# Patient Record
Sex: Female | Born: 1985 | Hispanic: Yes | Marital: Married | Smoking: Never smoker
Health system: Southern US, Community
[De-identification: ages and names within clinical notes are randomized; demographics above are authoritative.]

## PROBLEM LIST (undated history)

## (undated) DIAGNOSIS — O24419 Gestational diabetes mellitus in pregnancy, unspecified control: Secondary | ICD-10-CM

## (undated) HISTORY — DX: Gestational diabetes mellitus in pregnancy, unspecified control: O24.419

---

## 2007-07-18 ENCOUNTER — Inpatient Hospital Stay: Payer: Self-pay

## 2017-04-20 ENCOUNTER — Other Ambulatory Visit: Payer: Self-pay | Admitting: Otolaryngology

## 2017-04-20 DIAGNOSIS — IMO0001 Reserved for inherently not codable concepts without codable children: Secondary | ICD-10-CM

## 2017-04-20 DIAGNOSIS — H9042 Sensorineural hearing loss, unilateral, left ear, with unrestricted hearing on the contralateral side: Secondary | ICD-10-CM

## 2017-04-28 ENCOUNTER — Ambulatory Visit: Admission: RE | Admit: 2017-04-28 | Payer: Self-pay | Source: Ambulatory Visit

## 2018-02-03 NOTE — L&D Delivery Note (Signed)
Date of delivery 09/04/2018 Estimated Date of Delivery: 09/11/18 Patient's last menstrual period was 12/12/2017. EGA: [redacted]w[redacted]d  Delivery Note At 2:51 AM a viable female was delivered via Vaginal, Spontaneous (Presentation: cephalic; LOA).  APGAR: 1, 9; weight 7 lb 5.5 oz (3330 g).   Placenta status: spontaneous, intact, small vasculature on fetal bed, no calcifications, central insertion.  Cord: 3vv with the following complications: none apparent.  Cord pH: collected and declined by peds  Anesthesia:  None (Lidocaine for repair) Episiotomy: None Lacerations: 2nd degree Suture Repair: 3.0 vicryl rapide Est. Blood Loss (mL): 12cc  Mom presented to L&D with induction of labor for gestational diabetes. She tested COVID positive, and was placed in a negative pressure room with staff in appropriate PPE.  She was given cytotec, and then ruptured for meconium fluid. Pitocin was started for augmentation.  Progressed to complete.  Second stage: <36min, with delivery of fetal head with restitution to LOT.  Anterior then posterior shoulders delivered without difficulty.  Baby placed on mom's chest, and attended to by peds. Baby had done and then became flaccid, and the cord was cut quickly and peds brought the baby to the warmer for attention.  Neo NP was in attendance.  Cord portion collected and gasses drawn.  Cord blood also collected. Placenta spontaneously delivered, intact.   IV pitocin given for hemorrhage prophylaxis. 2nd degree was repaired with 3-0 vicryl rapide in standard fashion.   We sang happy birthday to baby Corliss Skains.   Mom to postpartum.  Baby to Couplet care / Skin to Skin.  Chelsea C Ward 09/04/2018, 8:11 AM

## 2018-02-24 LAB — OB RESULTS CONSOLE HGB/HCT, BLOOD: Hemoglobin: 13.2

## 2018-02-24 LAB — OB RESULTS CONSOLE ABO/RH: RH Type: POSITIVE

## 2018-02-24 LAB — OB RESULTS CONSOLE GC/CHLAMYDIA
Chlamydia: NEGATIVE
Gonorrhea: NEGATIVE

## 2018-02-24 LAB — OB RESULTS CONSOLE RPR: RPR: NONREACTIVE

## 2018-02-24 LAB — OB RESULTS CONSOLE HEPATITIS B SURFACE ANTIGEN: Hepatitis B Surface Ag: NEGATIVE

## 2018-02-24 LAB — OB RESULTS CONSOLE HIV ANTIBODY (ROUTINE TESTING): HIV: NONREACTIVE

## 2018-02-24 LAB — OB RESULTS CONSOLE ANTIBODY SCREEN: Antibody Screen: NEGATIVE

## 2018-02-25 ENCOUNTER — Other Ambulatory Visit: Payer: Self-pay | Admitting: Advanced Practice Midwife

## 2018-02-25 DIAGNOSIS — Z369 Encounter for antenatal screening, unspecified: Secondary | ICD-10-CM

## 2018-03-11 ENCOUNTER — Ambulatory Visit (HOSPITAL_BASED_OUTPATIENT_CLINIC_OR_DEPARTMENT_OTHER)
Admission: RE | Admit: 2018-03-11 | Discharge: 2018-03-11 | Disposition: A | Discharge status: Home / Self Care | Payer: BLUE CROSS/BLUE SHIELD | Source: Ambulatory Visit | Attending: Maternal & Fetal Medicine | Admitting: Maternal & Fetal Medicine

## 2018-03-11 ENCOUNTER — Ambulatory Visit
Admission: RE | Admit: 2018-03-11 | Discharge: 2018-03-11 | Disposition: A | Discharge status: Home / Self Care | Payer: BLUE CROSS/BLUE SHIELD | Source: Ambulatory Visit | Attending: Maternal & Fetal Medicine | Admitting: Maternal & Fetal Medicine

## 2018-03-11 VITALS — BP 119/64 | HR 92 | Temp 99.2°F | Resp 18 | Wt 223.0 lb

## 2018-03-11 DIAGNOSIS — Z3A13 13 weeks gestation of pregnancy: Secondary | ICD-10-CM | POA: Diagnosis not present

## 2018-03-11 DIAGNOSIS — O09521 Supervision of elderly multigravida, first trimester: Secondary | ICD-10-CM | POA: Insufficient documentation

## 2018-03-11 DIAGNOSIS — Z369 Encounter for antenatal screening, unspecified: Secondary | ICD-10-CM | POA: Diagnosis not present

## 2018-03-11 NOTE — Progress Notes (Signed)
Referring Provider:   [facility] Department Length of Consultation: 55 minutes  Ms. Agustiniano Bradly Bienenstock was referred to [facility] for genetic counseling because of advanced maternal age.  The patient will be 33 years old at the time of delivery.  This note summarizes the information we discussed with the aid of a Spanish interpreter.   We explained that the chance of a chromosome abnormality increases with maternal age.  Chromosomes and examples of chromosome problems were reviewed.  Humans typically have 46 chromosomes in each cell, with half passed through each sperm and egg.  Any change in the number or structure of chromosomes can increase the risk of problems in the physical and mental development of a pregnancy.   Based upon age of the patient, the chance of any chromosome abnormality was 1 in 36. The chance of Down syndrome, the most common chromosome problem associated with maternal age, was 1 in 1.  The risk of chromosome problems is in addition to the 3% general population risk for birth defects and mental retardation.  The greatest chance, of course, is that the baby would be born in good health.  We discussed the following prenatal screening and testing options for this pregnancy:  First trimester screening, which includes nuchal translucency ultrasound screen and first trimester maternal serum marker screening.  The nuchal translucency has approximately an 80% detection rate for Down syndrome and can be positive for other chromosome abnormalities as well as heart defects.  When combined with a maternal serum marker screening, the detection rate is up to 90% for Down syndrome and up to 97% for trisomy 18.     The chorionic villus sampling procedure is available for first trimester chromosome analysis.  This involves the withdrawal of a small amount of chorionic villi (tissue from the developing placenta).  Risk of pregnancy loss is estimated to  be approximately 1 in 200 to 1 in 100 (0.5 to 1%).  There is approximately a 1% (1 in 100) chance that the CVS chromosome results will be unclear.  Chorionic villi cannot be tested for neural tube defects.     Maternal serum marker screening, a blood test that measures pregnancy proteins, can provide risk assessments for Down syndrome, trisomy 18, and open neural tube defects (spina bifida, anencephaly). Because it does not directly examine the fetus, it cannot positively diagnose or rule out these problems.  Targeted ultrasound uses high frequency sound waves to create an image of the developing fetus.  An ultrasound is often recommended as a routine means of evaluating the pregnancy.  It is also used to screen for fetal anatomy problems (for example, a heart defect) that might be suggestive of a chromosomal or other abnormality.   Amniocentesis involves the removal of a small amount of amniotic fluid from the sac surrounding the fetus with the use of a thin needle inserted through the maternal abdomen and uterus.  Ultrasound guidance is used throughout the procedure.  Fetal cells from amniotic fluid are directly evaluated and > 99.5% of chromosome problems and > 98% of open neural tube defects can be detected. This procedure is generally performed after the 15th week of pregnancy.  The main risks to this procedure include complications leading to miscarriage in less than 1 in 200 cases (0.5%).  We also reviewed the availability of cell free fetal DNA testing from maternal blood to determine whether or not the baby may have either Down syndrome, trisomy 69, or trisomy 83.  This test utilizes a maternal blood sample and DNA sequencing technology to isolate circulating cell free fetal DNA from maternal plasma.  The fetal DNA can then be analyzed for DNA sequences that are derived from the three most common chromosomes involved in aneuploidy, chromosomes 13, 18, and 21.  If the overall amount of DNA is greater  than the expected level for any of these chromosomes, aneuploidy is suspected.  While we do not consider it a replacement for invasive testing and karyotype analysis, a negative result from this testing would be reassuring, though not a guarantee of a normal chromosome complement for the baby.  An abnormal result is certainly suggestive of an abnormal chromosome complement, though we would still recommend CVS or amniocentesis to confirm any findings from this testing.  Cystic Fibrosis and Spinal Muscular Atrophy (SMA) screening were also discussed with the patient. Both conditions are recessive, which means that both parents must be carriers in order to have a child with the disease.  Cystic fibrosis (CF) is one of the most common genetic conditions in persons of Caucasian ancestry.  This condition occurs in approximately 1 in 2,500 Caucasian persons and results in thickened secretions in the lungs, digestive, and reproductive systems.  For a baby to be at risk for having CF, both of the parents must be carriers for this condition.  Approximately 1 in 67 Caucasian persons is a carrier for CF.  Current carrier testing looks for the most common mutations in the gene for CF and can detect approximately 90% of carriers in the Caucasian population.  This means that the carrier screening can greatly reduce, but cannot eliminate, the chance for an individual to have a child with CF.  If an individual is found to be a carrier for CF, then carrier testing would be available for the partner. As part of [place]'s newborn screening profile, all babies born in the state of [place] will have a two-tier screening process.  Specimens are first tested to determine the concentration of immunoreactive trypsinogen (IRT).  The top 5% of specimens with the highest IRT values then undergo DNA testing using a panel of over 40 common CF mutations. SMA is a neurodegenerative disorder that leads to atrophy of skeletal muscle  and overall weakness.  This condition is also more prevalent in the Caucasian population, with 1 in 40-1 in 60 persons being a carrier and 1 in 6,000-1 in 10,000 children being affected.  There are multiple forms of the disease, with some causing death in infancy to other forms with survival into adulthood.  The genetics of SMA is complex, but carrier screening can detect up to 95% of carriers in the Caucasian population.  Similar to CF, a negative result can greatly reduce, but cannot eliminate, the chance to have a child with SMA.  The patient and her partner are both from [place].  Prior hemoglobinopathy screening was normal at [facility].  We obtained a detailed family history and pregnancy history.  Kern Alberta, the father of the baby, reported a paternal half brother with developmental delays, discolored patches of skin and growths under his skin.  He is 35 years old and Kern Alberta did not know the name for his condition.  We reviewed that there are genetic conditions, such as Neurofibromatosis, that would have features similar to those described, though without a specific diagnosis it is difficult to provide an accurate risk assessment for other family members.  If more is learned, he was encouraged to contact us. The  remainder of the family history is unremarkable for birth defects, developmental delays, recurrent pregnancy loss or known chromosome abnormalities.  Ms. Waymon Budgegustiniano Martinez stated that this is her fourth pregnancy, the third with Kern AlbertaSergio.  Her first pregnancy was when she was very young and she stated that is was a loss, though the details were not elaborated.  The couple has two children, ages 1716 and 10 years.  The 35 year old son has a thyroid condition but is otherwise healthy.  She reported no complications or exposures in this pregnancy that would be expected to increase the risk for birth defects.  After consideration of the options, Ms. Agustiniano Bradly BienenstockMartinez elected to proceed with MaterniT21 PLUS  with SCA and carrier screening for CF and SMA.    An ultrasound was performed at the time of the visit.  The gestational age was consistent with 13 weeks.  Fetal anatomy could not be assessed due to early gestational age.  Please refer to the ultrasound report for details of that study.  Ms. Waymon Budgegustiniano Martinez was encouraged to call with questions or concerns.  We can be contacted at [phone].   Tests Ordered:  MaterniT21 PLUS with SCA, CF and SMA carrier screening   Cherly Andersoneborah F. Vayden Weinand, MS, CGC

## 2018-03-16 LAB — MATERNIT21 PLUS CORE+SCA
Chromosome 13: NEGATIVE
Chromosome 18: NEGATIVE
Chromosome 21: NEGATIVE
Y Chromosome: DETECTED

## 2018-03-18 ENCOUNTER — Other Ambulatory Visit
Admission: RE | Admit: 2018-03-18 | Discharge: 2018-03-18 | Disposition: A | Discharge status: Home / Self Care | Payer: BLUE CROSS/BLUE SHIELD | Source: Ambulatory Visit | Attending: Maternal & Fetal Medicine | Admitting: Maternal & Fetal Medicine

## 2018-03-18 ENCOUNTER — Ambulatory Visit
Admission: RE | Admit: 2018-03-18 | Discharge: 2018-03-18 | Disposition: A | Discharge status: Home / Self Care | Payer: BLUE CROSS/BLUE SHIELD | Source: Ambulatory Visit | Attending: Maternal & Fetal Medicine | Admitting: Maternal & Fetal Medicine

## 2018-03-18 DIAGNOSIS — Z1379 Encounter for other screening for genetic and chromosomal anomalies: Secondary | ICD-10-CM | POA: Diagnosis present

## 2018-03-22 ENCOUNTER — Telehealth: Payer: Self-pay | Admitting: Obstetrics and Gynecology

## 2018-03-22 NOTE — Telephone Encounter (Signed)
The patient was informed of the results of her recent MaterniT21 testing which yielded NEGATIVE results.  The patient's specimen showed DNA consistent with two copies of chromosomes 21, 18 and 13.  The sensitivity for trisomy 25, trisomy 57 and trisomy 26 using this testing are reported as 99.1%, 99.9% and 91.7% respectively.  Thus, while the results of this testing are highly accurate, they are not considered diagnostic at this time.  Should more definitive information be desired, the patient may still consider amniocentesis.   As requested to know by the patient, sex chromosome analysis was included for this sample.  Results are normal, though the patient did not want to know the gender at this time. This is predicted with >99% accuracy.  A maternal serum AFP only should be considered if screening for neural tube defects is desired.  Testing for CF and SMA carrier screening is pending.  We may be reached at [phone] with any questions or concerns.   Cherly Anderson, MS, CGC

## 2018-03-24 LAB — CYSTIC FIBROSIS GENE TEST

## 2018-03-29 LAB — SMN1 COPY NUMBER ANALYSIS (SMA CARRIER SCREENING)

## 2018-03-29 NOTE — Progress Notes (Signed)
Pt seen by me, agree with assessment and plan as outlined in CGC Wells's note 

## 2018-04-01 ENCOUNTER — Telehealth: Payer: Self-pay | Admitting: Obstetrics and Gynecology

## 2018-04-01 NOTE — Telephone Encounter (Signed)
The patient was contacted with the assistance of a Spanish interpreter with the following results:  CF carrier testing:  CF is a genetic condition that occurs most often in Caucasian persons.  It primarily affects the lungs, digestive, and reproductive systems.  For someone to be at risk for having CF, both of their parents must be carriers for CF.  The testing can detect many persons who are carriers for CF and therefore determine if the pregnancy is at an increased risk for this condition.  The blood test results were negative when examined for the 32 most common mutations (or changes) in the gene for CF.  This means that she does not carry any of the most common changes in this gene.  Testing for these 32 mutations detects approximately 73% of carriers who are Hispanic.  Therefore, the chance that she is a carrier based on this negative result has been reduced from 1 in 46 to approximately 1 in 168.  Because this testing cannot detect all changes that may cause CF, we cannot eliminate the chance that this individual is a carrier completely.   SMA carrier screening results:  The results of the SMA carrier screening are also available.  SMA is also a recessive genetic condition with variable age of onset and severity caused by mutations in the SMN1 gene.  This carrier testing assesses the number of copies of this gene.  Persons with one copy of the SMN1 gene are carriers, and those with no copies are affected with the condition.  Individuals with two or more copies have a reduced chance to be a carrier.  Not all mutations can be detected with this testing, though it can detect 90.0% of carriers in the Hispanic population.  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  Again, this testing cannot eliminate the chance to have a child with SMA, but dramatically reduces the chance.    We encouraged the  patient to call with any questions or concerns as they arise.  We may be reached at [phone].  Cherly Anderson, MS, CGC

## 2018-04-12 ENCOUNTER — Other Ambulatory Visit: Payer: Self-pay

## 2018-04-12 DIAGNOSIS — O09521 Supervision of elderly multigravida, first trimester: Secondary | ICD-10-CM

## 2018-04-15 ENCOUNTER — Ambulatory Visit
Admission: RE | Admit: 2018-04-15 | Discharge: 2018-04-15 | Disposition: A | Discharge status: Home / Self Care | Payer: BLUE CROSS/BLUE SHIELD | Source: Ambulatory Visit | Attending: Maternal and Fetal Medicine | Admitting: Maternal and Fetal Medicine

## 2018-04-15 ENCOUNTER — Other Ambulatory Visit: Payer: Self-pay

## 2018-04-15 ENCOUNTER — Other Ambulatory Visit: Payer: Self-pay | Admitting: Maternal and Fetal Medicine

## 2018-04-15 DIAGNOSIS — O09521 Supervision of elderly multigravida, first trimester: Secondary | ICD-10-CM

## 2018-04-15 DIAGNOSIS — Z3A18 18 weeks gestation of pregnancy: Secondary | ICD-10-CM | POA: Diagnosis not present

## 2018-04-15 DIAGNOSIS — O09522 Supervision of elderly multigravida, second trimester: Secondary | ICD-10-CM | POA: Insufficient documentation

## 2018-04-15 DIAGNOSIS — O321XX0 Maternal care for breech presentation, not applicable or unspecified: Secondary | ICD-10-CM | POA: Diagnosis not present

## 2018-06-21 LAB — HIV ANTIBODY (ROUTINE TESTING W REFLEX): HIV 1&2 Ab, 4th Generation: NONREACTIVE

## 2018-06-22 LAB — OB RESULTS CONSOLE RPR: RPR: NONREACTIVE

## 2018-07-13 ENCOUNTER — Other Ambulatory Visit: Payer: Self-pay

## 2018-07-13 ENCOUNTER — Encounter: Payer: BC Managed Care – PPO | Attending: Nurse Practitioner | Admitting: *Deleted

## 2018-07-13 ENCOUNTER — Encounter: Payer: Self-pay | Admitting: *Deleted

## 2018-07-13 VITALS — BP 110/78 | Ht 64.0 in | Wt 233.5 lb

## 2018-07-13 DIAGNOSIS — O24410 Gestational diabetes mellitus in pregnancy, diet controlled: Secondary | ICD-10-CM

## 2018-07-13 DIAGNOSIS — O24419 Gestational diabetes mellitus in pregnancy, unspecified control: Secondary | ICD-10-CM | POA: Diagnosis present

## 2018-07-13 DIAGNOSIS — Z3A00 Weeks of gestation of pregnancy not specified: Secondary | ICD-10-CM | POA: Diagnosis not present

## 2018-07-13 NOTE — Patient Instructions (Signed)
Read booklet on Gestational Diabetes Follow Gestational Meal Planning Guidelines Avoid sugar sweetened drinks (tea, coffee, soda) Complete a 3 Day Food Record and bring to next appointment Check blood sugars 4 x day - before breakfast and 2 hrs after every meal and record  Bring blood sugar log to all appointments Call MD for prescription for meter strips and lancets Strips   Contour Next Lancets   Contour Microlet Purchase urine ketone strips if ordered by MD and check urine ketones every am:  If + increase bedtime snack to 1 protein and 2 carbohydrate servings Walk 20-30 minutes at least 5 x week if permitted by MD

## 2018-07-13 NOTE — Progress Notes (Signed)
Diabetes Self-Management Education  Visit Type: First/Initial  Appt. Start Time: 0930 Appt. End Time: 1130  07/13/2018  Courtney Munoz, identified by name and date of birth, is a 33 y.o. female with a diagnosis of Diabetes: Gestational Diabetes.   ASSESSMENT  Blood pressure 110/78, height 5\' 4"  (1.626 m), weight 233 lb 8 oz (105.9 kg), last menstrual period 12/12/2017. estimated date of delivery 09/11/2018 Body mass index is 40.08 kg/m.  Diabetes Self-Management Education - 07/13/18 1255      Visit Information   Visit Type  First/Initial      Initial Visit   Diabetes Type  Gestational Diabetes    Are you currently following a meal plan?  No    Are you taking your medications as prescribed?  Yes    Date Diagnosed  1 week ago      Health Coping   How would you rate your overall health?  Good      Psychosocial Assessment   Patient Belief/Attitude about Diabetes  Other (comment)   "something new to me"   Self-care barriers  Low literacy;English as a second language    Self-management support  Doctor's office;Family    Other persons present Interpreter    Patient Concerns  Nutrition/Meal planning;Other (comment)   "causes of diabetes"   Special Needs  Other (comment)   Spanish materials   Preferred Learning Style  Auditory    Learning Readiness  Ready    How often do you need to have someone help you when you read instructions, pamphlets, or other written materials from your doctor or pharmacy?  1 - Never   if written in Spanish   What is the last grade level you completed in school?  6th      Pre-Education Assessment   Patient understands the diabetes disease and treatment process.  Needs Instruction    Patient understands incorporating nutritional management into lifestyle.  Needs Instruction    Patient undertands incorporating physical activity into lifestyle.  Needs Instruction    Patient understands using medications safely.  Needs Instruction    Patient understands monitoring blood glucose, interpreting and using results  Needs Instruction    Patient understands prevention, detection, and treatment of acute complications.  Needs Instruction    Patient understands prevention, detection, and treatment of chronic complications.  Needs Instruction    Patient understands how to develop strategies to address psychosocial issues.  Needs Instruction    Patient understands how to develop strategies to promote health/change behavior.  Needs Instruction      Complications   How often do you check your blood sugar?  0 times/day (not testing)   Provided Contour Next meter and instructed in use. BG upon return demonstration was 96 mg/dL at 10:40 am - fasting.    Have you had a dilated eye exam in the past 12 months?  No    Have you had a dental exam in the past 12 months?  Yes    Are you checking your feet?  No      Dietary Intake   Breakfast  7:30 am - fast food biscuit - egg, cheese, bacon; sometimes milk and cereal    Snack (morning)  reports no snacks - doesn't eat any non-starchy vegetables    Lunch  2:00 pm - chicken with rice, beans, tortilla    Dinner  1:00 am - pt works 3rd shift - eats left overs from lunch    Beverage(s)  sugar sweetened tea, coffee, soda  Exercise   Exercise Type  ADL's      Patient Education   Previous Diabetes Education  No    Disease state   Definition of diabetes, type 1 and 2, and the diagnosis of diabetes;Factors that contribute to the development of diabetes    Nutrition management   Role of diet in the treatment of diabetes and the relationship between the three main macronutrients and blood glucose level;Reviewed blood glucose goals for pre and post meals and how to evaluate the patients' food intake on their blood glucose level.    Physical activity and exercise   Role of exercise on diabetes management, blood pressure control and cardiac health.    Monitoring  Taught/evaluated SMBG meter.;Purpose and  frequency of SMBG.;Taught/discussed recording of test results and interpretation of SMBG.;Ketone testing, when, how.    Chronic complications  Relationship between chronic complications and blood glucose control    Psychosocial adjustment  Identified and addressed patients feelings and concerns about diabetes    Preconception care  Pregnancy and GDM  Role of pre-pregnancy blood glucose control on the development of the fetus;Role of family planning for patients with diabetes;Reviewed with patient blood glucose goals with pregnancy      Individualized Goals (developed by patient)   Reducing Risk Know more about the causes of diabetes     Outcomes   Expected Outcomes  Demonstrated interest in learning. Expect positive outcomes       Individualized Plan for Diabetes Self-Management Training:   Learning Objective:  Patient will have a greater understanding of diabetes self-management. Patient education plan is to attend individual and/or group sessions per assessed needs and concerns.   Plan:   Patient Instructions  Read booklet on Gestational Diabetes Follow Gestational Meal Planning Guidelines Avoid sugar sweetened drinks (tea, coffee, soda) Complete a 3 Day Food Record and bring to next appointment Check blood sugars 4 x day - before breakfast and 2 hrs after every meal and record  Bring blood sugar log to all appointments Call MD for prescription for meter strips and lancets Strips   Contour Next Lancets   Contour Microlet Purchase urine ketone strips if ordered by MD and check urine ketones every am:  If + increase bedtime snack to 1 protein and 2 carbohydrate servings Walk 20-30 minutes at least 5 x week if permitted by MD  Expected Outcomes:  Demonstrated interest in learning. Expect positive outcomes  Education material provided:  Gestational Booklet (Spanish) Gestational Meal Planning Guidelines (Spanish) Simple Meal Plan (Spanish) Viewed Gestational Diabetes Video  (Spanish) Meter = Contour Next 3 Day Food Record (Spanish) Goals for a Healthy Pregnancy (Spanish)  If problems or questions, patient to contact team via:   Sharion SettlerSheila Alaney Witter, RN, CCM, CDE [phone]  Future DSME appointment:  July 20, 2018 with the dietitian

## 2018-07-20 ENCOUNTER — Encounter: Payer: Self-pay | Admitting: Dietician

## 2018-07-20 ENCOUNTER — Other Ambulatory Visit: Payer: Self-pay

## 2018-07-20 ENCOUNTER — Encounter: Payer: BC Managed Care – PPO | Admitting: Dietician

## 2018-07-20 DIAGNOSIS — O24419 Gestational diabetes mellitus in pregnancy, unspecified control: Secondary | ICD-10-CM | POA: Diagnosis not present

## 2018-07-20 DIAGNOSIS — O24410 Gestational diabetes mellitus in pregnancy, diet controlled: Secondary | ICD-10-CM

## 2018-07-20 NOTE — Progress Notes (Signed)
   Patient's BG record indicates BGs slightly above goal; one fasting reading of 96, and 2 days of post-meal readings ranging 93-131. Patient was given strips at her pharmacy that did not match the meter provided (she received regular contour strips but has a Contour Next meter). RN advised patient on correcting strips/ meter.   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 She states she has list of possible medications but does not know which to get. Advised her to consult pharmacist.  Patient's food diary indicates some varying carbohydrate intake, lack of vegetables with some meals.   Provided 1800kcal meal plan, and wrote individualized menus based on patient's food preferences.  Instructed patient on food safety, including avoidance of Listeriosis, and limiting mercury from fish.  Discussed importance of maintaining healthy lifestyle habits to reduce risk of Type 2 DM as well as Gestational DM with any future pregnancies.  Advised patient to use any remaining testing supplies to test some BGs after delivery, and to have BG tested ideally annually, as well as prior to attempting future pregnancies.

## 2018-07-28 DIAGNOSIS — Z8632 Personal history of gestational diabetes: Secondary | ICD-10-CM | POA: Insufficient documentation

## 2018-07-28 DIAGNOSIS — E669 Obesity, unspecified: Secondary | ICD-10-CM | POA: Insufficient documentation

## 2018-07-28 DIAGNOSIS — O24410 Gestational diabetes mellitus in pregnancy, diet controlled: Secondary | ICD-10-CM

## 2018-07-28 DIAGNOSIS — O0993 Supervision of high risk pregnancy, unspecified, third trimester: Secondary | ICD-10-CM

## 2018-07-28 DIAGNOSIS — Z6836 Body mass index (BMI) 36.0-36.9, adult: Secondary | ICD-10-CM

## 2018-07-28 DIAGNOSIS — O24419 Gestational diabetes mellitus in pregnancy, unspecified control: Secondary | ICD-10-CM | POA: Insufficient documentation

## 2018-08-04 ENCOUNTER — Ambulatory Visit: Payer: BC Managed Care – PPO | Admitting: Nurse Practitioner

## 2018-08-04 ENCOUNTER — Encounter: Payer: Self-pay | Admitting: Nurse Practitioner

## 2018-08-04 ENCOUNTER — Other Ambulatory Visit: Payer: Self-pay

## 2018-08-04 VITALS — BP 113/77 | Temp 97.7°F | Wt 238.8 lb

## 2018-08-04 DIAGNOSIS — O0993 Supervision of high risk pregnancy, unspecified, third trimester: Secondary | ICD-10-CM

## 2018-08-04 MED ORDER — PROMETHAZINE HCL 25 MG PO TABS: ORAL_TABLET | ORAL | 1 refills | Status: DC

## 2018-08-04 NOTE — Care Management Note (Signed)
    PRENATAL VISIT NOTE  Subjective:  Courtney Munoz is a 33 y.o. [id number] at [redacted]w[redacted]d being seen today for ongoing prenatal care.  She is currently monitored for the following issues for this high-risk pregnancy and has Advanced maternal age in multigravida, first trimester; Supervision of high risk pregnancy in third trimester; GDM (gestational diabetes mellitus); and Obesity, unspecified on their problem list.  Patient reports no complaints.   . Vag. Bleeding: None.  Movement: Present. denies vomiting, abdominal pain, fussiness, diarrhea, cough, difficulty breathing and c/ nausea, leaking of fluid/ROM.   The following portions of the patient's history were reviewed and updated as appropriate: allergies, current medications, past family history, past medical history, past social history, past surgical history and problem list. Problem list updated.  Objective:   Vitals:   08/04/18 0818  BP: 113/77  Temp: 97.7 F (36.5 C)  Weight: 238 lb 12.8 oz (108.3 kg)    Fetal Status: Fetal Heart Rate (bpm): 156 Fundal Height: 35 cm Movement: Present     General:  Alert, oriented and cooperative. Patient is in no acute distress.  Skin: Skin is warm and dry. No rash noted.   Cardiovascular: Normal heart rate noted  Respiratory: Normal respiratory effort, no problems with respiration noted  Abdomen: Soft, gravid, appropriate for gestational age.        Pelvic: Cervical exam deferred        Extremities: Normal range of motion.  Edema: Trace  Mental Status: Normal mood and affect. Normal behavior. Normal judgment and thought content.   Assessment and Plan:  Pregnancy: [id number] at [redacted]w[redacted]d  There are no diagnoses linked to this encounter.  Preterm labor symptoms and general obstetric precautions including but not limited to vaginal bleeding, contractions, leaking of fluid and fetal movement were reviewed in detail with the patient. Please refer to After Visit Summary for other counseling  recommendations.  No follow-ups on file.  No future appointments.  Berniece Andreas, NP

## 2018-08-04 NOTE — Progress Notes (Signed)
In for visit; BS log copied; c/o heartburn unrelieved with Tums.

## 2018-08-04 NOTE — Progress Notes (Signed)
Patient ID: Courtney Munoz, female   DOB: [birth year], 33 y.o.   MRN: [id number]

## 2018-08-05 LAB — URINALYSIS
Bilirubin, UA: NEGATIVE
Glucose, UA: NEGATIVE
Ketones, UA: NEGATIVE
Leukocytes,UA: NEGATIVE
Nitrite, UA: NEGATIVE
RBC, UA: NEGATIVE
Specific Gravity, UA: >1.03 — ABNORMAL HIGH (ref 1.005–1.030)
Urobilinogen, Ur: 0.2 mg/dL (ref 0.2–1.0)
pH, UA: 6 (ref 5.0–7.5)

## 2018-08-09 ENCOUNTER — Other Ambulatory Visit: Payer: Self-pay

## 2018-08-09 NOTE — Progress Notes (Signed)
Patient in to clinic with [facility] paperwork. Paperwork completed and given back to patient.

## 2018-08-15 DIAGNOSIS — O0993 Supervision of high risk pregnancy, unspecified, third trimester: Secondary | ICD-10-CM

## 2018-08-18 ENCOUNTER — Ambulatory Visit: Payer: BC Managed Care – PPO | Admitting: Nurse Practitioner

## 2018-08-18 ENCOUNTER — Other Ambulatory Visit: Payer: Self-pay | Admitting: Family Medicine

## 2018-08-18 ENCOUNTER — Other Ambulatory Visit: Payer: Self-pay

## 2018-08-18 VITALS — BP 115/72 | Temp 97.5°F | Wt 239.2 lb

## 2018-08-18 DIAGNOSIS — O24410 Gestational diabetes mellitus in pregnancy, diet controlled: Secondary | ICD-10-CM

## 2018-08-18 DIAGNOSIS — K219 Gastro-esophageal reflux disease without esophagitis: Secondary | ICD-10-CM

## 2018-08-18 DIAGNOSIS — O0993 Supervision of high risk pregnancy, unspecified, third trimester: Secondary | ICD-10-CM

## 2018-08-18 LAB — URINALYSIS
Bilirubin, UA: NEGATIVE
Glucose, UA: NEGATIVE
Ketones, UA: NEGATIVE
Leukocytes,UA: NEGATIVE
Nitrite, UA: NEGATIVE
Protein,UA: NEGATIVE
Specific Gravity, UA: 1.025 (ref 1.005–1.030)
Urobilinogen, Ur: 0.2 mg/dL (ref 0.2–1.0)
pH, UA: 6 (ref 5.0–7.5)

## 2018-08-18 NOTE — Progress Notes (Signed)
36.4 week RV. Not taking PNV consistently. Has discontinued ASA. Complains of continued acid reflux. 36 week labs and packet today. Hal Morales, RN

## 2018-08-18 NOTE — Progress Notes (Signed)
PRENATAL VISIT NOTE  Subjective:  Courtney Munoz is a 33 y.o. [id number] at 683w4d being seen today for ongoing prenatal care.  She is currently monitored for the following issues for this high-risk pregnancy and has Advanced maternal age in multigravida, first trimester; Supervision of high risk pregnancy in third trimester; GDM (gestational diabetes mellitus); and Obesity, unspecified on their problem list.  Patient reports heartburn and constipation.   .  .   . denies vomiting, abdominal pain, fussiness, diarrhea, cough and difficulty breathing leaking of fluid/ROM.   The following portions of the patient's history were reviewed and updated as appropriate: allergies, current medications, past family history, past medical history, past social history, past surgical history and problem list. Problem list updated.  Objective:   Vitals:   08/18/18 0826  BP: 115/72  Temp: (!) 97.5 F (36.4 C)  Weight: 239 lb 3.2 oz (108.5 kg)    Fetal Status:           General:  Alert, oriented and cooperative. Patient is in no acute distress.  Skin: Skin is warm and dry. No rash noted.   Cardiovascular: Normal heart rate noted  Respiratory: Normal respiratory effort, no problems with respiration noted  Abdomen: Soft, gravid, appropriate for gestational age.        Pelvic: Cervical exam deferred        Extremities: Normal range of motion.     Mental Status: Normal mood and affect. Normal behavior. Normal judgment and thought content.   Assessment and Plan:  Pregnancy: [id number] at 153w4d  There are no diagnoses linked to this encounter.  Client c/o the following symptoms: Nausea - not relieved by Tums and additional OTC medications, advised client to try BRAT diet until symptoms subside.  Plan to - Talk with Dr. Alvester MorinNewton about additional medications? For nausea - no vomiting (acid reflux)  Constipation - discussed including additional foods high in fiber content and increasing water intake to  8 glasses daily.  "Swollen parts in vaginal area" - admits pressure in labia, more on right side - discussed increase in blood in vaginal area. Advised cool compresses to assist with decrease in pressure.   Reviewed BS log -  2hr glucose levels > 50% over 120 - client started on Glyburide 2.5 mg tab po q daily Plan to follow up with client in 1 week to re evaluate  PHQ 9 score - 1 - no further action required at this time Provider collected - 36 wks labs -  await results   Preterm labor symptoms and general obstetric precautions including but not limited to vaginal bleeding, contractions, leaking of fluid and fetal movement were reviewed in detail with the patient. Please refer to After Visit Summary for other counseling recommendations.  No follow-ups on file.  No future appointments.  Donn PieriniKarla W Shaquille Janes, NP     STI clinic/screening visit  Subjective:  Courtney Munoz is a 33 y.o. female being seen today for an STI screening visit. The patient reports they do have symptoms.  Patient has the following medical conditionshas Advanced maternal age in multigravida, first trimester; Supervision of high risk pregnancy in third trimester; GDM (gestational diabetes mellitus); and Obesity, unspecified on their problem list.  Chief Complaint  Patient presents with  . Routine Prenatal Visit    36.4 weeks    Patient reports  HPI   See flowsheet for further details and programmatic requirements.    The following portions of the patient's history were reviewed and updated as  appropriate: allergies, current medications, past family history, past medical history, past social history, past surgical history and problem list. Problem list updated.  Objective:   Vitals:   08/18/18 0826  BP: 115/72  Temp: (!) 97.5 F (36.4 C)  Weight: 239 lb 3.2 oz (108.5 kg)    Physical Exam    Assessment and Plan:  Courtney Munoz is a 33 y.o. female presenting to the  [facility] Department for STI screening  There are no diagnoses linked to this encounter.    No follow-ups on file.  No future appointments.  Courtney Andreas, NP

## 2018-08-18 NOTE — Progress Notes (Addendum)
   PRENATAL VISIT NOTE  Subjective:  Courtney Munoz is a 33 y.o. [id number] at [redacted]w[redacted]d being seen today for ongoing prenatal care.  She is currently monitored for the following issues for this high-risk pregnancy and has Advanced maternal age in multigravida, first trimester; Supervision of high risk pregnancy in third trimester; GDM (gestational diabetes mellitus); and Obesity, unspecified on their problem list.  Patient reports no complaints.  Contractions: Not present. Vag. Bleeding: None.  Movement: Present. denies vomiting, abdominal pain, fussiness, diarrhea, cough and difficulty breathing leaking of fluid/ROM.   The following portions of the patient's history were reviewed and updated as appropriate: allergies, current medications, past family history, past medical history, past social history, past surgical history and problem list. Problem list updated.  Objective:   Vitals:   08/18/18 0826  BP: 115/72  Temp: (!) 97.5 F (36.4 C)  Weight: 239 lb 3.2 oz (108.5 kg)    Fetal Status:     Movement: Present  Presentation: Vertex  General:  Alert, oriented and cooperative. Patient is in no acute distress.  Skin: Skin is warm and dry. No rash noted.   Cardiovascular: Normal heart rate noted  Respiratory: Normal respiratory effort, no problems with respiration noted  Abdomen: Soft, gravid, appropriate for gestational age.  Pain/Pressure: Absent     Pelvic: Cervical exam deferred        Extremities: Normal range of motion.  Edema: None  Mental Status: Normal mood and affect. Normal behavior. Normal judgment and thought content.   Assessment and Plan:  Pregnancy: [id number] at [redacted]w[redacted]d  1. Diet controlled gestational diabetes mellitus (GDM) in third trimester Reviewed BS log - client started on Glyburide 2.5 mg tab - 1 tab po daily - prescription written, #30, RF#1  2. Gastroesophageal reflux disease, esophagitis presence not specified Plan to discuss with Dr. Ernestina Patches  concerning additional options  3. Supervision of high risk pregnancy in third trimester Provider collected - await results - Strep Gp B NAA - Chlamydia/GC NAA, Confirmation   Preterm labor symptoms and general obstetric precautions including but not limited to vaginal bleeding, contractions, leaking of fluid and fetal movement were reviewed in detail with the patient. Please refer to After Visit Summary for other counseling recommendations.  Return in about 1 week (around 08/25/2018) for routine prenatal care - review BS log with start of Glyburide 2.5 mg tab po q daily.  Future Appointments  Date Time Provider [place]  08/25/2018  8:40 AM [facility] PROVIDER AC-MAT None    Berniece Andreas, NP

## 2018-08-18 NOTE — Patient Instructions (Signed)
Glyburide; Metformin tablets Qu es este medicamento? La combinacin GLIBURIDA; METFORMINA ayuda a tratar la diabetes tipo 2. El tratamiento se [place] con una dieta y [place]. Este medicamento ayuda al cuerpo a usar la insulina de manera ms eficiente. Este medicamento puede ser utilizado para otros usos; si tiene alguna pregunta consulte con su proveedor de atencin mdica o con su farmacutico. MARCAS COMUNES: Glucovance Qu le debo informar a mi profesional de la salud antes de tomar este medicamento? Necesitan saber si usted presenta alguno de los siguientes problemas o situaciones: anemia deshidratacin cetoacidosis diabtica deficiencia de glucosa-6-fosfato deshidrogenasa enfermedad cardiaca si bebe alcohol con frecuencia enfermedad renal enfermedad heptica sndrome de ovario poliqustico infeccin o lesin grave enfermedad tiroidea vmito una reaccin alrgica o inusual a la gliburida, a la metformina, sulfonamidas, a otros medicamentos, alimentos, colorantes o conservantes si est embarazada o buscando quedar embarazada si est amamantando a un beb Cmo debo utilizar este medicamento? Tome este medicamento por va oral con las comidas. Ingiralo con un vaso de agua. Siga las instrucciones de la etiqueta del [place]. Tome su medicamento a la [employer]. No lo tome con una frecuencia mayor a la indicada. Hable con su pediatra para informarse acerca del uso de este medicamento en nios. Puede requerir Sales executive. Los pacientes mayores de 65 aos de edad pueden necesitar dosis menores que los adultos ms jvenes. Sobredosis: Pngase en contacto inmediatamente con un centro toxicolgico o una sala de urgencia si usted cree que haya tomado demasiado medicamento. ATENCIN: [employer] es solo para usted. No comparta este medicamento con nadie. Qu sucede si me olvido de una dosis? Si olvida una dosis, tmela lo antes posible. Si es casi la hora de la prxima  dosis, tome slo esa dosis. No tome dosis adicionales o dobles. Qu puede interactuar con este medicamento? No tome este medicamento con ninguno de los siguientes frmacos: bosentano ciertos medicamentos de contraste que se administran antes de una radiografa, tomografa computarizada, IRM (MRI) u otros procedimientos dofetilida Este medicamento tambin puede interactuar con los siguientes frmacos: acetazolamida alcohol aspirina y medicamentos tipo aspirina ciertos medicamentos antivirales para VIH o hepatitis ciertos medicamentos para la presin sangunea, enfermedad cardiaca y ritmo cardiaco irregular cloranfenicol cimetidina ciprofloxacino claritromicina colesevelam diclorfenamida digoxina diurticos hormonas femeninas, como estrgenos o progestinas, y pldoras anticonceptivas glicopirrolato isoniazida lamotrigina medicamentos llamados inhibidores de la monoamino oxidasa (IMAO), tales como [place], Parnate, Marplan y Eldepryl memantina metazolamida metoclopramida miconazol midodrina niacina AINE, medicamentos para el dolor y la inflamacin, como ibuprofeno o naproxeno fenotiazinas, tales como Chief of Staff, Musician, Government social research officer, tioridazina fenitona probenecida ranolazina medicamentos esteroideos, como la prednisona o la cortisona medicamentos estimulantes para trastornos de Freight forwarder, Sports coach de peso o mantenerse despierto medicamentos para la tiroides topiramato trospio vandetanib warfarina zonisamida Puede ser que esta lista no menciona todas las posibles interacciones. Informe a su profesional de [employer]	[place] de [employer] productos a base de hierbas, medicamentos de [place] o suplementos nutritivos que est tomando. Si usted fuma, consume bebidas alcohlicas o si utiliza drogas ilegales, indqueselo tambin a su profesional de [employer]	[place]. Algunas sustancias pueden interactuar con su medicamento. A qu debo estar atento al usar [employer]? Visite a su mdico o a su profesional de la salud  para que revise regularmente su evolucin. Se monitorizar una prueba llamada Hemoglobina A1C (A1C). Es un anlisis de sangre sencillo. Mide su control del nivel de azcar en la sangre durante los ltimos 2 a 3 meses. Se le realizar esta prueba cada 3 a  6 meses. Aprenda a revisar su nivel de azcar en la sangre. Conozca los sntomas del nivel bajo y elevado de azcar en la sangre y cmo controlarlos. Siempre lleve con usted una fuente rpida de azcar por si tiene sntomas de nivel bajo de azcar [employer]. Algunos ejemplos incluyen caramelos duros de azcar o tabletas de glucosa. Asegrese de que otras personas sepan que usted se puede ahogar si come o bebe cuando presenta sntomas graves de [place] bajo de azcar en la sangre, como convulsiones o prdida del conocimiento. Deben obtener ayuda mdica de inmediato. Informe a su mdico o profesional de la salud si tiene niveles elevados de Dispensing optician. Es posible que deba cambiar la dosis de su medicamento. Si est enfermo o hace ms ejercicio que lo habitual, es posible que necesite cambiar la dosis de su medicamento. No saltee comidas. Pregunte a su mdico o profesional de la salud si debe evitar el alcohol. Muchos productos de venta libre para la tos y el resfro contienen azcar o alcohol. Estos pueden afectar los niveles de Dispensing optician. Este medicamento puede causar ovulacin en mujeres premenopusicas que no tienen periodos mensuales regulares. [place] posibilidades de quedar embarazada. No debe tomar este medicamento si queda embarazada o si cree que puede estar embarazada. Hable con su mdico o profesional de [employer] sus opciones de mtodos anticonceptivos mientras toma este medicamento. Contacte de inmediato a su mdico o profesional de la salud si cree que est embarazada. Si va a someterse a una operacin, IRM (MRI), tomografa computarizada u otro procedimiento, informe a su mdico que est tomando [employer]. Puede ser necesario dejar de usar este medicamento antes del procedimiento. Este medicamento puede aumentar su sensibilidad al sol. Evite la [employer]. Si no la Product manager, utilice ropa protectora y crema de Photographer. No utilice lmparas solares, camas solares ni cabinas solares. Use un brazalete o una cadena de identificacin mdica, y lleve con usted una tarjeta que describa su enfermedad, detalles de su medicamento y horario de dosis. Debe asegurarse de recibir suficientes vitaminas mientras toma este medicamento. Converse con su profesional de la salud sobre los alimentos que come y las vitaminas que toma. Qu efectos secundarios puedo tener al [employer] este medicamento? Efectos secundarios que debe informar a su mdico o a Barrister's clerk de la salud tan pronto como sea posible: Chief of Staff, como erupcin cutnea, comezn/picazn o urticaria, e hinchazn de la cara, los labios o la lengua problemas respiratorios orina oscura sensacin de [place] o aturdimiento, cadas fiebre, escalofros, dolor de garganta dolores musculares enrojecimiento, formacin de ampollas, descamacin o distensin de la piel, incluso dentro de la boca signos y sntomas de niveles bajos de Dispensing optician, tales como ansiedad, confusin, [place], aumento del apetito, debilidad o cansancio inusuales, sudoracin, temblores, fro, irritabilidad, Social research officer, government de cabeza, visin borrosa, ritmo cardiaco rpido, prdida del conocimiento ritmo cardiaco lento o irregular sangrado o moretones inusuales dolor o Tree surgeon estomacal inusual vmito color amarillento de los ojos o la piel Efectos secundarios que generalmente no requieren atencin mdica (infrmelos a su mdico o a Barrister's clerk de la salud si persisten o si son molestos): diarrea mareos dolor de Librarian, academic en la boca Higher education careers adviser Puede ser que esta lista no menciona todos los posibles efectos secundarios. Comunquese a su mdico  por asesoramiento mdico [employer] Inc. Usted puede informar los efectos secundarios a la FDA por telfono al 1-800-FDA-1088. Dnde  debo guardar mi medicina? Mantngala fuera del alcance de los nios. Gurdela a [employer], entre 15 y 3 grados C (81 y 58 grados F). Mantenga el envase bien cerrado y protjalo de la luz. Deseche todo el medicamento que no haya utilizado, despus de la fecha de vencimiento. ATENCIN: Este folleto es un resumen. Puede ser que no cubra toda la posible informacin. Si usted tiene preguntas acerca de esta medicina, consulte con su mdico, su farmacutico o su profesional de Technical sales engineer.  2020 Elsevier/Gold Standard (2017-06-02 00:00:00)

## 2018-08-20 LAB — STREP GP B NAA: Strep Gp B NAA: NEGATIVE

## 2018-08-22 NOTE — Progress Notes (Signed)
PRENATAL VISIT NOTE  Subjective:  Courtney Munoz is a 33 y.o. [id number] at [redacted]w[redacted]d being seen today for ongoing prenatal care.  She is currently monitored for the following issues for this high-risk pregnancy and has Advanced maternal age in multigravida, first trimester; Supervision of high risk pregnancy in third trimester; GDM (gestational diabetes mellitus); and Obesity, unspecified on their problem list.  Patient reports no complaints.   . Vag. Bleeding: None.  Movement: Present. denies vomiting, abdominal pain, fussiness, diarrhea, cough and difficulty breathing leaking of fluid/ROM.   The following portions of the patient's history were reviewed and updated as appropriate: allergies, current medications, past family history, past medical history, past social history, past surgical history and problem list. Problem list updated.  Objective:   Vitals:   08/04/18 0818  BP: 113/77  Temp: 97.7 F (36.5 C)  Weight: 238 lb 12.8 oz (108.3 kg)    Fetal Status: Fetal Heart Rate (bpm): 156 Fundal Height: 35 cm Movement: Present     General:  Alert, oriented and cooperative. Patient is in no acute distress.  Skin: Skin is warm and dry. No rash noted.   Cardiovascular: Normal heart rate noted  Respiratory: Normal respiratory effort, no problems with respiration noted  Abdomen: Soft, gravid, appropriate for gestational age.        Pelvic: Cervical exam deferred        Extremities: Normal range of motion.  Edema: Trace  Mental Status: Normal mood and affect. Normal behavior. Normal judgment and thought content.   Assessment and Plan:  Pregnancy: [id number] at [redacted]w[redacted]d  1. Supervision of high risk pregnancy in third trimester Client doing well at this time. Client denies any additional questions or concerns at this time. OB flow sheet questions reviewed. Client verbalizes understanding and is in agreement with plan of care.  Await results - Urinalysis (Urine Dip)   Preterm  labor symptoms and general obstetric precautions including but not limited to vaginal bleeding, contractions, leaking of fluid and fetal movement were reviewed in detail with the patient. Please refer to After Visit Summary for other counseling recommendations.  Return in about 2 weeks (around 08/18/2018) for 36 wk labs at that time.  Future Appointments  Date Time Provider [place]  08/25/2018  8:40 AM [facility] PROVIDER AC-MAT None    Berniece Andreas, NP     Subjective:  Felton Clinton Edsel Munoz is a 33 y.o. female being seen today  The patient reports they do not have symptoms.  Patient has the following medical conditionshas Advanced maternal age in multigravida, first trimester; Supervision of high risk pregnancy in third trimester; GDM (gestational diabetes mellitus); and Obesity, unspecified on their problem list.  Chief Complaint  Patient presents with  . Routine Prenatal Visit    Patient reports  HPI   See flowsheet for further details and programmatic requirements.    The following portions of the patient's history were reviewed and updated as appropriate: allergies, current medications, past family history, past medical history, past social history, past surgical history and problem list. Problem list updated.  Objective:   Vitals:   08/04/18 0818  BP: 113/77  Temp: 97.7 F (36.5 C)  Weight: 238 lb 12.8 oz (108.3 kg)    Physical Exam    Assessment and Plan:  Courtney Munoz is a 33 y.o. female  1. Supervision of high risk pregnancy in third trimester Client doing well at this time. Client denies any additional questions or concerns at this time. OB flow  sheet questions reviewed. Client verbalizes understanding and is in agreement with plan of care.  Await results - Urinalysis (Urine Dip)     Return in about 2 weeks (around 08/18/2018) for 36 wk labs at that time.  Future Appointments  Date Time Provider Department Center   08/25/2018  8:40 AM [facility] PROVIDER AC-MAT None    Donn PieriniKarla W Leath, NP

## 2018-08-24 LAB — CHLAMYDIA/GC NAA, CONFIRMATION
Chlamydia trachomatis, NAA: NEGATIVE
Neisseria gonorrhoeae, NAA: NEGATIVE

## 2018-08-25 ENCOUNTER — Other Ambulatory Visit: Payer: Self-pay

## 2018-08-25 ENCOUNTER — Encounter: Payer: Self-pay | Admitting: Nurse Practitioner

## 2018-08-25 ENCOUNTER — Ambulatory Visit: Payer: BC Managed Care – PPO | Admitting: Nurse Practitioner

## 2018-08-25 VITALS — BP 114/75 | Temp 98.0°F | Wt 241.0 lb

## 2018-08-25 DIAGNOSIS — R8271 Bacteriuria: Secondary | ICD-10-CM

## 2018-08-25 DIAGNOSIS — O24415 Gestational diabetes mellitus in pregnancy, controlled by oral hypoglycemic drugs: Secondary | ICD-10-CM

## 2018-08-25 DIAGNOSIS — O0993 Supervision of high risk pregnancy, unspecified, third trimester: Secondary | ICD-10-CM

## 2018-08-25 LAB — URINALYSIS
Bilirubin, UA: NEGATIVE
Glucose, UA: NEGATIVE
Ketones, UA: NEGATIVE
Nitrite, UA: NEGATIVE
Protein,UA: NEGATIVE
RBC, UA: NEGATIVE
Specific Gravity, UA: 1.025 (ref 1.005–1.030)
Urobilinogen, Ur: 0.2 mg/dL (ref 0.2–1.0)
pH, UA: 6.5 (ref 5.0–7.5)

## 2018-08-25 MED ORDER — GLYBURIDE 1.25 MG PO TABS: 2.5000 mg | ORAL_TABLET | Freq: Two times a day (BID) | ORAL | Status: DC

## 2018-08-25 NOTE — Progress Notes (Addendum)
   PRENATAL VISIT NOTE  Subjective:  Courtney Munoz is a 33 y.o. [id number] at [redacted]w[redacted]d being seen today for ongoing prenatal care.  She is currently monitored for the following issues for this high-risk pregnancy and has Advanced maternal age in multigravida, first trimester; Supervision of high risk pregnancy in third trimester; GDM (gestational diabetes mellitus); and Obesity, unspecified on their problem list.  Patient reports no complaints.  Contractions: Not present. Vag. Bleeding: None.  Movement: Present. Denies leaking of fluid/ROM.   The following portions of the patient's history were reviewed and updated as appropriate: allergies, current medications, past family history, past medical history, past social history, past surgical history and problem list. Problem list updated.  Objective:   Vitals:   08/25/18 0845  BP: 114/75  Temp: 98 F (36.7 C)  Weight: 241 lb (109.3 kg)    Fetal Status: Fetal Heart Rate (bpm): 156 Fundal Height: 38 cm Movement: Present  Presentation: Vertex  General:  Alert, oriented and cooperative. Patient is in no acute distress.  Skin: Skin is warm and dry. No rash noted.   Cardiovascular: Normal heart rate noted  Respiratory: Normal respiratory effort, no problems with respiration noted  Abdomen: Soft, gravid, appropriate for gestational age.  Pain/Pressure: Absent     Pelvic: Cervical exam deferred        Extremities: Normal range of motion.  Edema: Trace  Mental Status: Normal mood and affect. Normal behavior. Normal judgment and thought content.   Assessment and Plan:  Pregnancy: [id number] at [redacted]w[redacted]d  1. Supervision of high risk pregnancy in third trimester Continue to monitor - await results - Urinalysis (Urine Dip)  Cloudy and +2 leu noted - await C&S results  2. Gestational diabetes mellitus (GDM) in third trimester controlled on oral hypoglycemic drug Reviewed BS log - readings greater than 50% 95-120. Client currently taking  Glyburide 2.5 mg tab po q daily (HS) - increasing to Glyburide 2.5 mg po BID  Client advised of reason and plan to RTC on 09/30/2018 for review and IOL  Client states she needs more test strips for glucose monitor - spoke with pharmacy at [place] for client to get RF of strips Client verbalizes understanding and is in agreement with plan of care  - Urinalysis (Urine Dip) - glyBURIDE (DIABETA) tablet 2.5 mg   Term labor symptoms and general obstetric precautions including but not limited to vaginal bleeding, contractions, leaking of fluid and fetal movement were reviewed in detail with the patient. Please refer to After Visit Summary for other counseling recommendations.  Return in about 5 days (around 08/30/2018) for routine prenatal care - check Increase Glyguride 2.5 mg BID.  No future appointments.  Berniece Andreas, NP

## 2018-08-25 NOTE — Progress Notes (Signed)
   PRENATAL VISIT NOTE  Subjective:  Courtney Munoz is a 33 y.o. [id number] at [redacted]w[redacted]d being seen today for ongoing prenatal care.  She is currently monitored for the following issues for this high-risk pregnancy and has Advanced maternal age in multigravida, first trimester; Supervision of high risk pregnancy in third trimester; GDM (gestational diabetes mellitus); and Obesity, unspecified on their problem list.  Patient reports no complaints.  Contractions: Not present. Vag. Bleeding: None.  Movement: Present. Denies leaking of fluid/ROM.   The following portions of the patient's history were reviewed and updated as appropriate: allergies, current medications, past family history, past medical history, past social history, past surgical history and problem list. Problem list updated.  Objective:   Vitals:   08/25/18 0845  BP: 114/75  Temp: 98 F (36.7 C)  Weight: 241 lb (109.3 kg)    Fetal Status: Fetal Heart Rate (bpm): 156 Fundal Height: 38 cm Movement: Present  Presentation: Vertex  General:  Alert, oriented and cooperative. Patient is in no acute distress.  Skin: Skin is warm and dry. No rash noted.   Cardiovascular: Normal heart rate noted  Respiratory: Normal respiratory effort, no problems with respiration noted  Abdomen: Soft, gravid, appropriate for gestational age.  Pain/Pressure: Absent     Pelvic: Cervical exam deferred        Extremities: Normal range of motion.  Edema: Trace  Mental Status: Normal mood and affect. Normal behavior. Normal judgment and thought content.   Assessment and Plan:  Pregnancy: [id number] at [redacted]w[redacted]d  There are no diagnoses linked to this encounter.  Term labor symptoms and general obstetric precautions including but not limited to vaginal bleeding, contractions, leaking of fluid and fetal movement were reviewed in detail with the patient. Please refer to After Visit Summary for other counseling recommendations.  Return in about 5 days  (around 08/30/2018) for routine prenatal care - check Increase Glyguride 2.5 mg BID.  No future appointments.  Berniece Andreas, NP

## 2018-08-25 NOTE — Patient Instructions (Signed)
Diabetes mellitus gestacional, diagnstico Gestational Diabetes Mellitus, Diagnosis La diabetes gestacional (diabetes mellitus gestacional) es una forma de diabetes a corto plazo (temporal) que puede presentarse durante Water quality scientist. Este cuadro desaparece despus del parto. Puede deberse a uno de [employer] o a ambos:  El pncreas no produce suficiente cantidad de una hormona llamada insulina.  El cuerpo no responde de forma normal a la insulina que produce. La insulina permite que los ciertos azcares (glucosa) ingresen a las clulas del cuerpo. Esto le proporciona energa. Si tiene diabetes, los azcares no pueden ingresar a las clulas. Esto produce un aumento del nivel de Dispensing optician (hiperglucemia). Si tiene diabetes gestacional:  Es ms probable que vuelva a tenerla si queda embarazada nuevamente.  Es ms probable que desarrolle diabetes tipo2 en el futuro. Si la diabetes gestacional se trata, es posible que ni usted ni el beb se vean afectados. El mdico fijar los objetivos del tratamiento para usted. En general, los resultados de los niveles de azcar en la sangre deben ser los siguientes:  Despus de no comer durante mucho tiempo (ayunar): 95mg /dl (5,71mmol/l).  Despus de las comidas (posprandial): ? Una hora despus de una comida: igual o menor que 140mg /dl (7,47mmol/l). ? Dos horas despus de una comida: igual o menor que 120mg /dl (6,66mmol/l).  Nivel de A1c (hemoglobinaA1c): del 6% al 6,5%. Siga estas indicaciones en su casa: Preguntas para hacerle al mdico   Puede hacer las siguientes preguntas: ? Es necesario que consulte a Radio broadcast assistant en el cuidado de la diabetes? ? Qu equipos necesitar para cuidarme en casa? ? Qu medicamentos necesito? Cundo debo tomarlos? ? Con qu frecuencia debo controlar mi nivel de azcar en la sangre? ? A qu nmero puedo llamar si tengo preguntas? ? Cundo es mi prxima cita con el mdico? Instrucciones  generales  [employer] de venta libre y los recetados solamente como se lo haya indicado el mdico.  Mantenga un peso saludable durante el [place].  Concurra a todas las visitas de seguimiento como se lo haya indicado el mdico. Esto es importante. Comunquese con un mdico si:  Su nivel de azcar en la sangre es igual o mayor que 240mg /dl (13,9mmol/dl).  Su nivel de azcar en la sangre es igual o mayor que 200mg /dl (11,28mmol/l), y tiene cetonas en la orina.  Ha estado enferma o ha tenido fiebre durante ms de 2 das y no [place].  Tiene alguno de estos problemas durante ms de 6horas: ? No puede comer ni beber. ? Siente malestar estomacal (nuseas). ? Vomita. ? Presenta heces lquidas (diarrea). Solicite ayuda de inmediato si:  El nivel de azcar en la sangre est por debajo de 54mg /dl (53mmol/l).  Se siente confundida.  Tiene dificultad para hacer lo siguiente: ? Pensar con claridad. ? Respirar.  El beb se mueve menos de lo normal.  Tiene alguno de estos sntomas: ? Niveles moderados o altos de cetonas en la orina. ? Sangre proveniente de la vagina. ? Secrecin de un lquido fuera de lo comn de la vagina. ? Contracciones prematuras. Estas pueden causar una sensacin de opresin en el vientre. Resumen  La diabetes gestacional es una forma de diabetes a corto plazo. Puede suceder mientras est embarazada. Este cuadro desaparece despus del parto.  Si la diabetes gestacional se trata, es posible que ni usted ni el beb se vean afectados. El mdico fijar los objetivos del tratamiento para usted.  Concurra a todas las visitas de seguimiento como se lo haya indicado el mdico.  Esto es importante. Esta informacin no tiene como fin reemplazar el consejo del mdico. Asegrese de hacerle al mdico cualquier pregunta que tenga. Document Released: 05/14/2015 Document Revised: 11/18/2016 Document Reviewed: 02/23/2015 Elsevier Patient Education  2020 Elsevier  Inc.  

## 2018-08-25 NOTE — Addendum Note (Signed)
Addended by: Jerline Pain on: 08/25/2018 09:52 AM   Modules accepted: Orders

## 2018-08-25 NOTE — Progress Notes (Signed)
In for visit; reports taking PNV & Glyburide at night Debera Lat, RN

## 2018-08-27 LAB — URINE CULTURE

## 2018-08-30 ENCOUNTER — Other Ambulatory Visit: Payer: Self-pay

## 2018-08-30 ENCOUNTER — Ambulatory Visit: Payer: BC Managed Care – PPO | Admitting: Family Medicine

## 2018-08-30 VITALS — BP 110/74 | Temp 98.3°F | Wt 242.0 lb

## 2018-08-30 DIAGNOSIS — O99213 Obesity complicating pregnancy, third trimester: Secondary | ICD-10-CM

## 2018-08-30 DIAGNOSIS — O24415 Gestational diabetes mellitus in pregnancy, controlled by oral hypoglycemic drugs: Secondary | ICD-10-CM

## 2018-08-30 DIAGNOSIS — O09521 Supervision of elderly multigravida, first trimester: Secondary | ICD-10-CM

## 2018-08-30 DIAGNOSIS — O0993 Supervision of high risk pregnancy, unspecified, third trimester: Secondary | ICD-10-CM

## 2018-08-30 MED ORDER — GLYBURIDE 5 MG PO TABS: ORAL_TABLET | ORAL | 2 refills | Status: DC

## 2018-08-30 NOTE — Progress Notes (Signed)
PRENATAL VISIT NOTE  Subjective:  [place] Agustiniano Bradly BienenstockMartinez is a 33 y.o. [id number] at 6123w2d being seen today for ongoing prenatal care.  She is currently monitored for the following issues for this high-risk pregnancy and has Advanced maternal age in multigravida, first trimester; Supervision of high risk pregnancy in third trimester; GDM (gestational diabetes mellitus); and Obesity, unspecified on their problem list.  Patient reports no complaints.  Contractions: Not present. Vag. Bleeding: None.  Movement: Present.Denies leaking of fluid/ROM.   The following portions of the patient's history were reviewed and updated as appropriate: allergies, current medications, past family history, past medical history, past social history, past surgical history and problem list. Problem list updated.  Objective:   Vitals:   08/30/18 1609  BP: 110/74  Temp: 98.3 F (36.8 C)  Weight: 242 lb (109.8 kg)    Fetal Status: Fetal Heart Rate (bpm): 145 Fundal Height: 39 cm Movement: Present  Presentation: Vertex  General:  Alert, oriented and cooperative. Patient is in no acute distress.  Skin: Skin is warm and dry. No rash noted.   Cardiovascular: Normal heart rate noted  Respiratory: Normal respiratory effort, no problems with respiration noted  Abdomen: Soft, gravid, appropriate for gestational age.  Pain/Pressure: Absent     Pelvic: Cervical exam performed Dilation: Fingertip Effacement (%): Thick Station: -3  Extremities: Normal range of motion.  Edema: Trace  Mental Status: Normal mood and affect. Normal behavior. Normal judgment and thought content.   Assessment and Plan:  Pregnancy: [id number] at 5723w2d  1. Gestational diabetes mellitus (GDM) in third trimester controlled on oral hypoglycemic drug  Reviewed chart and patient was started on Glyburide on 7/15 and 7/22 but not started on antenatal testing (NSTs), No US ordered for EFW nor was IOl at 39 weeks discussed with patient.   Seeing her  today blood sugars continue to be somewhat poorly controlled.  Fasting: 78-95   Bfast 110-125 ( 7 of 11 above 120)  Lunch 104-141 (8 of 10 above 120)  Dinner 107-125 (1 of 3 above 120)  We discussed in detail the need for EFW to assess fetal growth. She was hesitant to get this due to cost. I [facility] which [facility] also [facility]   IOL was requested for 39 wk (Saturday)-- form filled out and to be faxed by RN.   If no IOL on 09/04/2018- Patient will need IOL ASAP and NST on Monday 09/06/2018  Reviewed increasing glyburide to target post-prandial blood sugars.  glyBURIDE (DIABETA) 5 MG tablet; Take 5 mg (1 pill in the morning) and 2.5mg  (1/2 pill) in the evening  Dispense: 45 tablet; Refill: 2  [facility] Physician staff contacted via epic to assure they are aware of the need for IOL and high risk nature of pregnancy  2. Advanced maternal age in multigravida, first trimester Genetic screening wnl this pregnancy  3. Supervision of high risk pregnancy in third trimester Up to date with routine care GBS neg Reviewed the importance of fetal movement and regular kick counts  4. Obesity in pregnancy TWG= 27 lb (12.2 kg) which is above goal for her BMI   Term labor symptoms and general obstetric precautions including but not limited to vaginal bleeding, contractions, leaking of fluid and fetal movement were reviewed in detail with the patient. Please refer to After Visit Summary for other counseling recommendations.  Return in about 5 days (around 09/04/2018) [facility] .  Future Appointments  Date Time Provider [place]  09/06/2018  4:00 PM [facility] PROVIDER [facility] None    Caren Macadam, MD

## 2018-08-30 NOTE — Progress Notes (Signed)
In for visit; BS log copied; reports taking Glyburide BID Debera Lat, RN

## 2018-08-31 ENCOUNTER — Telehealth: Payer: Self-pay

## 2018-08-31 DIAGNOSIS — O99210 Obesity complicating pregnancy, unspecified trimester: Secondary | ICD-10-CM | POA: Insufficient documentation

## 2018-08-31 LAB — URINALYSIS
Bilirubin, UA: NEGATIVE
Ketones, UA: NEGATIVE
Leukocytes,UA: NEGATIVE
Nitrite, UA: NEGATIVE
Protein,UA: NEGATIVE
Specific Gravity, UA: 1.02 (ref 1.005–1.030)
Urobilinogen, Ur: 1 mg/dL (ref 0.2–1.0)
pH, UA: 5.5 (ref 5.0–7.5)

## 2018-08-31 NOTE — Telephone Encounter (Signed)
Received call from [facility] with [place] appt today at 4 pm (arrive 3:45 pm). Language Line (ID # [id number]) utilized and client notified of appt. Client agreeable with plan for [place] today. Client aware [facility] is working on scheduling her IOL appt. Shona Needles, RN

## 2018-09-01 ENCOUNTER — Telehealth: Payer: Self-pay | Admitting: Family Medicine

## 2018-09-01 ENCOUNTER — Ambulatory Visit
Admission: RE | Admit: 2018-09-01 | Discharge: 2018-09-01 | Disposition: A | Discharge status: Home / Self Care | Payer: BC Managed Care – PPO | Source: Ambulatory Visit

## 2018-09-01 ENCOUNTER — Telehealth: Payer: Self-pay

## 2018-09-01 NOTE — Telephone Encounter (Signed)
Per fax from [place] to pt and informed of IOL scheduled 09/03/18 @ 8:00 am; pt. Agreed to plan; interpreted by M. [place] Makara Lanzo, RN

## 2018-09-02 ENCOUNTER — Other Ambulatory Visit: Payer: Self-pay | Admitting: Obstetrics and Gynecology

## 2018-09-02 NOTE — Telephone Encounter (Signed)
Returned call-answered ?'s regarding L & D IOL and given [facility] phone# for other questions.  Interpreted by M. [place]

## 2018-09-02 NOTE — Progress Notes (Signed)
IOL orders for admit 

## 2018-09-03 ENCOUNTER — Other Ambulatory Visit: Payer: Self-pay

## 2018-09-03 ENCOUNTER — Inpatient Hospital Stay
Admission: RE | Admit: 2018-09-03 | Discharge: 2018-09-05 | DRG: 805 | Disposition: A | Discharge status: Home / Self Care | Payer: BC Managed Care – PPO | Attending: Obstetrics & Gynecology | Admitting: Obstetrics & Gynecology

## 2018-09-03 DIAGNOSIS — Z3A39 39 weeks gestation of pregnancy: Secondary | ICD-10-CM

## 2018-09-03 DIAGNOSIS — O99214 Obesity complicating childbirth: Secondary | ICD-10-CM | POA: Diagnosis present

## 2018-09-03 DIAGNOSIS — O24425 Gestational diabetes mellitus in childbirth, controlled by oral hypoglycemic drugs: Secondary | ICD-10-CM | POA: Diagnosis present

## 2018-09-03 DIAGNOSIS — O24419 Gestational diabetes mellitus in pregnancy, unspecified control: Secondary | ICD-10-CM | POA: Diagnosis present

## 2018-09-03 DIAGNOSIS — E669 Obesity, unspecified: Secondary | ICD-10-CM | POA: Diagnosis present

## 2018-09-03 DIAGNOSIS — U071 COVID-19: Secondary | ICD-10-CM | POA: Diagnosis present

## 2018-09-03 DIAGNOSIS — O9852 Other viral diseases complicating childbirth: Secondary | ICD-10-CM | POA: Diagnosis present

## 2018-09-03 DIAGNOSIS — R6339 Other feeding difficulties: Secondary | ICD-10-CM

## 2018-09-03 DIAGNOSIS — R633 Feeding difficulties: Secondary | ICD-10-CM

## 2018-09-03 LAB — COMPREHENSIVE METABOLIC PANEL
ALT: 18 U/L (ref 0–44)
AST: 19 U/L (ref 15–41)
Albumin: 2.6 g/dL — ABNORMAL LOW (ref 3.5–5.0)
Alkaline Phosphatase: 102 U/L (ref 38–126)
Anion gap: 8 (ref 5–15)
BUN: 6 mg/dL (ref 6–20)
CO2: 18 mmol/L — ABNORMAL LOW (ref 22–32)
Calcium: 8.3 mg/dL — ABNORMAL LOW (ref 8.9–10.3)
Chloride: 112 mmol/L — ABNORMAL HIGH (ref 98–111)
Creatinine, Ser: 0.55 mg/dL (ref 0.44–1.00)
GFR calc Af Amer: >60 mL/min (ref >60)
GFR calc non Af Amer: >60 mL/min (ref >60)
Glucose, Bld: 73 mg/dL (ref 70–99)
Potassium: 3.7 mmol/L (ref 3.5–5.1)
Sodium: 138 mmol/L (ref 135–145)
Total Bilirubin: 0.4 mg/dL (ref 0.3–1.2)
Total Protein: 6 g/dL — ABNORMAL LOW (ref 6.5–8.1)

## 2018-09-03 LAB — TYPE AND SCREEN
ABO/RH(D): O POS
Antibody Screen: NEGATIVE

## 2018-09-03 LAB — CBC
HCT: 41.3 % (ref 36.0–46.0)
Hemoglobin: 13.9 g/dL (ref 12.0–15.0)
MCH: 32.6 pg (ref 26.0–34.0)
MCHC: 33.7 g/dL (ref 30.0–36.0)
MCV: 96.9 fL (ref 80.0–100.0)
Platelets: 281 10*3/uL (ref 150–400)
RBC: 4.26 MIL/uL (ref 3.87–5.11)
RDW: 13.3 % (ref 11.5–15.5)
WBC: 6.2 10*3/uL (ref 4.0–10.5)
nRBC: 0 % (ref 0.0–0.2)

## 2018-09-03 LAB — SARS CORONAVIRUS 2 BY RT PCR (HOSPITAL ORDER, PERFORMED IN ~~LOC~~ HOSPITAL LAB): SARS Coronavirus 2: POSITIVE — AB

## 2018-09-03 LAB — GLUCOSE, CAPILLARY
Glucose-Capillary: 77 mg/dL (ref 70–99)
Glucose-Capillary: 159 mg/dL — ABNORMAL HIGH (ref 70–99)
Glucose-Capillary: 117 mg/dL — ABNORMAL HIGH (ref 70–99)

## 2018-09-03 MED ORDER — OXYTOCIN BOLUS FROM INFUSION
500.0000 mL | Freq: Once | INTRAVENOUS | Status: AC
  Administered 2018-09-04: 500 mL via INTRAVENOUS

## 2018-09-03 MED ORDER — OXYTOCIN 40 UNITS IN NORMAL SALINE INFUSION - SIMPLE MED
2.5000 [IU]/h | INTRAVENOUS | Status: DC
  Filled 2018-09-03: qty 1000

## 2018-09-03 MED ORDER — AMMONIA AROMATIC IN INHA
RESPIRATORY_TRACT | Status: AC
  Filled 2018-09-03: qty 10

## 2018-09-03 MED ORDER — MISOPROSTOL 25 MCG QUARTER TABLET
25.0000 ug | ORAL_TABLET | ORAL | Status: DC | PRN
  Administered 2018-09-03 (×2): 25 ug via VAGINAL
  Filled 2018-09-03 (×4): qty 1

## 2018-09-03 MED ORDER — MISOPROSTOL 200 MCG PO TABS
ORAL_TABLET | ORAL | Status: AC
  Filled 2018-09-03: qty 4

## 2018-09-03 MED ORDER — OXYTOCIN 10 UNIT/ML IJ SOLN
INTRAMUSCULAR | Status: AC
  Filled 2018-09-03: qty 2

## 2018-09-03 MED ORDER — LACTATED RINGERS IV SOLN
INTRAVENOUS | Status: DC
  Administered 2018-09-03 (×2): via INTRAVENOUS

## 2018-09-03 MED ORDER — BUTORPHANOL TARTRATE 2 MG/ML IJ SOLN
1.0000 mg | INTRAMUSCULAR | Status: DC | PRN
  Administered 2018-09-04: 1 mg via INTRAVENOUS
  Filled 2018-09-03: qty 1

## 2018-09-03 MED ORDER — LIDOCAINE HCL (PF) 1 % IJ SOLN
INTRAMUSCULAR | Status: AC
  Administered 2018-09-04: 30 mL via SUBCUTANEOUS
  Filled 2018-09-03: qty 30

## 2018-09-03 MED ORDER — SOD CITRATE-CITRIC ACID 500-334 MG/5ML PO SOLN: 30.0000 mL | ORAL | Status: DC | PRN

## 2018-09-03 MED ORDER — ONDANSETRON HCL 4 MG/2ML IJ SOLN
4.0000 mg | Freq: Four times a day (QID) | INTRAMUSCULAR | Status: DC | PRN
  Administered 2018-09-04: 4 mg via INTRAVENOUS
  Filled 2018-09-03: qty 2

## 2018-09-03 MED ORDER — TERBUTALINE SULFATE 1 MG/ML IJ SOLN: 0.2500 mg | Freq: Once | INTRAMUSCULAR | Status: DC | PRN

## 2018-09-03 MED ORDER — ACETAMINOPHEN 325 MG PO TABS: 650.0000 mg | ORAL_TABLET | ORAL | Status: DC | PRN

## 2018-09-03 MED ORDER — LACTATED RINGERS IV SOLN: 500.0000 mL | INTRAVENOUS | Status: DC | PRN

## 2018-09-03 MED ORDER — LIDOCAINE HCL (PF) 1 % IJ SOLN
30.0000 mL | INTRAMUSCULAR | Status: AC | PRN
  Administered 2018-09-04: 30 mL via SUBCUTANEOUS

## 2018-09-03 MED ORDER — OXYTOCIN 40 UNITS IN NORMAL SALINE INFUSION - SIMPLE MED
1.0000 m[IU]/min | INTRAVENOUS | Status: DC
  Administered 2018-09-04: 2 m[IU]/min via INTRAVENOUS

## 2018-09-03 MED ORDER — MISOPROSTOL 25 MCG QUARTER TABLET
25.0000 ug | ORAL_TABLET | ORAL | Status: DC | PRN
  Administered 2018-09-03 (×2): 25 ug via BUCCAL
  Filled 2018-09-03 (×3): qty 1

## 2018-09-04 LAB — RPR: RPR Ser Ql: NONREACTIVE

## 2018-09-04 MED ORDER — COCONUT OIL OIL: 1.0000 "application " | TOPICAL_OIL | Status: DC | PRN

## 2018-09-04 MED ORDER — ONDANSETRON HCL 4 MG/2ML IJ SOLN: 4.0000 mg | INTRAMUSCULAR | Status: DC | PRN

## 2018-09-04 MED ORDER — ACETAMINOPHEN 500 MG PO TABS: 1000.0000 mg | ORAL_TABLET | Freq: Four times a day (QID) | ORAL | Status: DC | PRN

## 2018-09-04 MED ORDER — IBUPROFEN 600 MG PO TABS
600.0000 mg | ORAL_TABLET | Freq: Four times a day (QID) | ORAL | Status: DC
  Administered 2018-09-04 – 2018-09-05 (×5): 600 mg via ORAL
  Filled 2018-09-04 (×6): qty 1

## 2018-09-04 MED ORDER — PRENATAL MULTIVITAMIN CH
1.0000 | ORAL_TABLET | Freq: Every day | ORAL | Status: DC
  Administered 2018-09-04 – 2018-09-05 (×2): 1 via ORAL
  Filled 2018-09-04 (×2): qty 1

## 2018-09-04 MED ORDER — HYDROCORTISONE (PERIANAL) 2.5 % EX CREA
TOPICAL_CREAM | CUTANEOUS | Status: DC
  Filled 2018-09-04: qty 28.35

## 2018-09-04 MED ORDER — OXYTOCIN 40 UNITS IN NORMAL SALINE INFUSION - SIMPLE MED: 1.0000 m[IU]/min | INTRAVENOUS | Status: DC

## 2018-09-04 MED ORDER — WITCH HAZEL-GLYCERIN EX PADS: 1.0000 "application " | MEDICATED_PAD | CUTANEOUS | Status: DC

## 2018-09-04 MED ORDER — BENZOCAINE-MENTHOL 20-0.5 % EX AERO
1.0000 "application " | INHALATION_SPRAY | CUTANEOUS | Status: DC | PRN
  Administered 2018-09-04: 1 via TOPICAL
  Filled 2018-09-04: qty 56

## 2018-09-04 MED ORDER — DIPHENHYDRAMINE HCL 25 MG PO CAPS: 25.0000 mg | ORAL_CAPSULE | Freq: Four times a day (QID) | ORAL | Status: DC | PRN

## 2018-09-04 MED ORDER — DOCUSATE SODIUM 100 MG PO CAPS
100.0000 mg | ORAL_CAPSULE | Freq: Two times a day (BID) | ORAL | Status: DC
  Administered 2018-09-05: 100 mg via ORAL
  Filled 2018-09-04: qty 1

## 2018-09-04 MED ORDER — SIMETHICONE 80 MG PO CHEW: 80.0000 mg | CHEWABLE_TABLET | ORAL | Status: DC | PRN

## 2018-09-04 MED ORDER — ONDANSETRON HCL 4 MG PO TABS: 4.0000 mg | ORAL_TABLET | ORAL | Status: DC | PRN

## 2018-09-04 NOTE — H&P (Signed)
OB History & Physical   History of Present Illness:  Chief Complaint:   HPI:  Courtney Munoz is a 33 y.o. [id number] female at [redacted]w[redacted]d dated by 18wk ultrasound with approximate LMP of 12/13/2018. Estimated Date of Delivery: 09/11/18  She presents to L&D for scheduled induction of labor for poor compliance with gestational diabetes on oral medication.  +FM, no CTX, no LOF, no VB  Pregnancy Issues: 1.  GDMA2, poorly compliant with glyburide 2.5mg  2. Obesity, BMI 36 3. AMA 4. COVID positive  Maternal Medical History:   Past Medical History:  Diagnosis Date  . Gestational diabetes     History reviewed. No pertinent surgical history.  No Known Allergies  Prior to Admission medications   Medication Sig Start Date End Date Taking? Authorizing Provider  CONTOUR TEST test strip  07/13/18  Yes [provider]  glyBURIDE (DIABETA) 5 MG tablet Take 5 mg (1 pill in the morning) and 2.5mg  (1/2 pill) in the evening 08/30/18  Yes Caren Macadam, MD  Microlet Lancets [place]  07/13/18  Yes [provider]  Prenatal Vit-Fe Fumarate-FA (PRENATAL MULTIVITAMIN) TABS tablet Take 1 tablet by mouth daily at 12 noon.   Yes [provider]  promethazine (PHENERGAN) 25 MG tablet 1/2 - 1 tab po q 4-6 hrs prn nausea 08/04/18  Yes Willaim Sheng, NP  aspirin 81 MG EC tablet Take 81 mg by mouth daily. Swallow whole.    [provider]     Prenatal care site: [facility] Dept   Social History: She  reports that she has never smoked. She has never used smokeless tobacco. She reports previous alcohol use. She reports that she does not use drugs.  Family History: family history includes Asthma in her daughter; Cancer in her mother; Eczema in her son; HIV/AIDS in her sister; Heart disease in her paternal grandfather.   Review of Systems: A full review of systems was performed and negative except as noted in the HPI.     Physical Exam:  Vital  Signs: BP 123/83   Pulse 83   Temp 97.8 F (36.6 C) (Oral)   Resp 18   Ht 5\' 3"  (1.6 m)   Wt 110.2 kg   LMP 12/12/2017   Breastfeeding Unknown   BMI 43.05 kg/m  General: no acute distress.  HEENT: normocephalic, atraumatic Heart: regular rate & rhythm.  No murmurs/rubs/gallops Lungs: clear to auscultation bilaterally, normal respiratory effort Abdomen: soft, gravid, non-tender;  EFW: 8lb6oz Pelvic:   External: Normal external female genitalia  Cervix: 2cm / 60% / -2, posterior, firm.  membranes intact    Extremities: non-tender, symmetric, 1+ edema bilaterally.  DTRs: 2+ Neurologic: Alert & oriented x 3.    Results for orders placed or performed during the hospital encounter of 09/03/18 (from the past 24 hour(s))  CBC     Status: None   Collection Time: 09/03/18  8:24 AM  Result Value Ref Range   WBC 6.2 4.0 - 10.5 K/uL   RBC 4.26 3.87 - 5.11 MIL/uL   Hemoglobin 13.9 12.0 - 15.0 g/dL   HCT 41.3 36.0 - 46.0 %   MCV 96.9 80.0 - 100.0 fL   MCH 32.6 26.0 - 34.0 pg   MCHC 33.7 30.0 - 36.0 g/dL   RDW 13.3 11.5 - 15.5 %   Platelets 281 150 - 400 K/uL   nRBC 0.0 0.0 - 0.2 %  Comprehensive metabolic panel     Status: Abnormal   Collection Time:  09/03/18  8:24 AM  Result Value Ref Range   Sodium 138 135 - 145 mmol/L   Potassium 3.7 3.5 - 5.1 mmol/L   Chloride 112 (H) 98 - 111 mmol/L   CO2 18 (L) 22 - 32 mmol/L   Glucose, Bld 73 70 - 99 mg/dL   BUN 6 6 - 20 mg/dL   Creatinine, Ser 1.610.55 0.44 - 1.00 mg/dL   Calcium 8.3 (L) 8.9 - 10.3 mg/dL   Total Protein 6.0 (L) 6.5 - 8.1 g/dL   Albumin 2.6 (L) 3.5 - 5.0 g/dL   AST 19 15 - 41 U/L   ALT 18 0 - 44 U/L   Alkaline Phosphatase 102 38 - 126 U/L   Total Bilirubin 0.4 0.3 - 1.2 mg/dL   GFR calc non Af Amer >60 >60 mL/min   GFR calc Af Amer >60 >60 mL/min   Anion gap 8 5 - 15  Type and screen     Status: None   Collection Time: 09/03/18  8:24 AM  Result Value Ref Range   ABO/RH(D) O POS    Antibody Screen NEG    Sample  Expiration      09/06/2018,2359 Performed at [facility], [address] [place], [place] [address]   SARS Coronavirus 2 [facility](Hospital order, Performed in [facility] lab)     Status: Abnormal   Collection Time: 09/03/18  8:24 AM  Result Value Ref Range   SARS Coronavirus 2 POSITIVE (A) NEGATIVE  Glucose, capillary     Status: None   Collection Time: 09/03/18  4:03 PM  Result Value Ref Range   Glucose-Capillary 77 70 - 99 mg/dL    Pertinent Results:  Prenatal Labs: Blood type/Rh O pos  Antibody screen neg  Rubella Immune  Varicella Immune  RPR NR  HBsAg Neg  HIV NR  GC neg  Chlamydia neg  Genetic screening Negative NIPT  1 hour GTT 157  3 hour GTT 81 / 231 / 163 / 51  GBS negative   FHT: 135 mod + accels no decels TOCO: irritable   Cephalic by leopolds   Assessment:  Courtney Munoz is a 33 y.o. 413P2013 female at 3724w0d with induction of labor for poorly controlled GDMA2, with positive asymptomatic COVID .   Plan:  1. Admit to Labor & Delivery, negative pressure room 2. CBC, T&S, Clrs, IVF 3. GBS negative   4. Consents obtained. 5. Continuous efm/toco 6. IOL with cytotec vaginally/buccally.  Continue until bishop >8 or if contractions are adequate in frequency. 7. IUP: category 1 8. COVID: neg pressure room, appropriate PPE for staff and mask for patient and husband.   ----- Ranae Plumberhelsea Arron Mcnaught, MD Attending Obstetrician and Gynecologist [facility], Department of OB/GYN [facility]

## 2018-09-04 NOTE — Progress Notes (Signed)
Intrapartum progress note:  S: comfortable, barely feeling contractions  O: BP 123/83   Pulse 83   Temp 97.8 F (36.6 C) (Oral)   Resp 18   Ht 5\' 3"  (1.6 m)   Wt 110.2 kg   LMP 12/12/2017   Breastfeeding Unknown   BMI 43.05 kg/m    SVE: 5.5cm, 80%, -1, slightly deviated to left, AROM for meconium stained fluid TOCO: q71min FHR: 135 mod + accels no decels  A/P: 33yo [id number] with IOL for GDMA2 and COVID +  1. COVID: asymptomatic 2. IOL: s/p cytotec x2, now AROM.  If no further change in 2hours start pitocin 3. IUP: category 1 tracing 4: GDMA - 2hr post prandial, 117 5. Continue active management  ----- Larey Days, MD, [place] Attending Obstetrician and Gynecologist [facility], Department of [facility]

## 2018-09-04 NOTE — Lactation Note (Signed)
This note was copied from a baby's chart. Lactation Consultation Note  Patient Name: Courtney Munoz Today's Date: 09/04/2018 Reason for consult: Initial assessment;Early term 7-38.6wks M om does not speak English, fob interpreted as spanish interpreter not available, baby has trouble latching consistenly, not cueing, more aggressive at last feed per transition nurse, Arbie Cookey, reports sl flat nipples and baby not aggressive enough to latch well, sucks on tongue, 20 mm nipple shield tried per Deneen Harts, RN and this feeding went better, I instructed mom to nurse frequently with cues and attempt without shield first, then if poor latch may try shield, encouraged frequent feeds to increase milk production as she states she doesn't know if she has much milk., given 24 mm nipple shield if 20 mm seems too small.   Maternal Data Formula Feeding for Exclusion: No Does the patient have breastfeeding experience prior to this delivery?: Yes Mom states she breast fed her older children without problems Feeding Feeding Type: Breast Fed  LATCH Score Latch: (did not observe a feeding)  Audible Swallowing: Spontaneous and intermittent  Type of Nipple: Everted at rest and after stimulation  Comfort (Breast/Nipple): Soft / non-tender  Hold (Positioning): Full assist, staff holds infant at breast  LATCH Score: 7  Interventions Interventions: Breast feeding basics reviewed;Assisted with latch;Hand express;Breast compression;Adjust position;Support pillows;Position options(nipple shield)  Lactation Tools Discussed/Used Tools: Nipple Shields Nipple shield size: 20 WIC Program: No   Consult Status Consult Status: PRN    Ferol Luz 09/04/2018, 7:09 PM

## 2018-09-04 NOTE — Discharge Summary (Signed)
Obstetrical Discharge Summary  Patient Name: Courtney Munoz DOB: 13-Jul-1985 MRN: [id number]  Date of Admission: 09/03/2018 Date of Delivery: 09/04/2018 Delivered by: Ranae Plumberhelsea Ward, MD Date of Discharge: 09/05/2018 Primary OB: [facility] NWG:[id number]:Patient's last menstrual period was 12/12/2017. EDC Estimated Date of Delivery: 09/11/18 Gestational Age at Delivery: 1289w0d   Antepartum complications:  1.  GDMA2, poorly compliant with glyburide 2.5mg  2. Obesity, BMI 36 3. AMA 4. COVID positive   Admitting Diagnosis: induction of labor, gestational diabetes Secondary Diagnosis: Patient Active Problem List   Diagnosis Date Noted  . Gestational diabetes mellitus (GDM) in third trimester 09/03/2018  . Obesity affecting pregnancy 08/31/2018  . Supervision of high risk pregnancy in third trimester 07/28/2018  . GDM (gestational diabetes mellitus) 07/28/2018  . Obesity, unspecified 07/28/2018  . Advanced maternal age in multigravida, first trimester     Augmentation: AROM, Pitocin and Cytotec Complications: None Intrapartum complications/course: Mom presented to L&D with induction of labor for gestational diabetes. She tested COVID positive, and was placed in a negative pressure room with staff in appropriate PPE.  She was given cytotec, and then ruptured for meconium fluid. Pitocin was started for augmentation.  Progressed to complete.  Second stage: <5715min, with delivery of fetal head with restitution to LOT.  Anterior then posterior shoulders delivered without difficulty.  Baby placed on mom's chest, and attended to by peds. Baby had done and then became flaccid, and the cord was cut quickly and peds brought the baby to the warmer for attention.  Neo NP was in attendance.  Cord portion collected and gasses drawn.  Cord blood also collected. Placenta spontaneously delivered, intact.   IV pitocin given for hemorrhage prophylaxis. 2nd degree was repaired with 3-0 vicryl rapide in standard fashion.   Delivery Type: spontaneous vaginal delivery Anesthesia: none Placenta: spontaneous Laceration: 2nd degree Episiotomy: none Newborn Data: Live born female "[name]" Birth Weight: 7 lb 5.5 oz (3330 g) APGAR: 1, 9  Newborn Delivery   Birth date/time: 09/04/2018 02:51:00 Delivery type: Vaginal, Spontaneous       Postpartum Procedures:   Post partum course:Patient had an uncomplicated postpartum course.  By time of discharge on PPD#1, her pain was controlled on oral pain medications; she had appropriate lochia and was ambulating, voiding without difficulty and tolerating regular diet.  She was deemed stable for discharge to home.     Discharge Physical Exam: BP 134/85 (BP Location: Left Arm)   Pulse 97   Temp 98.4 F (36.9 C) (Axillary)   Resp 18   Ht 5\' 3"  (1.6 m)   Wt 110.2 kg   LMP 12/12/2017   Breastfeeding Unknown   BMI 43.05 kg/m   General: NAD CV: RRR Pulm: CTABL, nl effort ABD: s/nd/nt, fundus firm and below the umbilicus Lochia: moderate DVT Evaluation: LE non-ttp, no evidence of DVT on exam.  Hemoglobin  Date Value Ref Range Status  09/05/2018 13.4 12.0 - 15.0 g/dL Final  65/78/469601/22/2020 29.513.2  Final   HCT  Date Value Ref Range Status  09/05/2018 40.7 36.0 - 46.0 % Final     Disposition: stable, discharge to home. Baby Feeding: breastmilk Baby Disposition: home with mom  Rh Immune globulin given: n/a Rubella vaccine given: n/a Flu vaccine given in AP or PP setting: AP  Contraception: Nexplanon  Prenatal Labs:  Blood type/Rh O pos  Antibody screen neg  Rubella Immune  Varicella Immune  RPR NR  HBsAg Neg  HIV NR  GC neg  Chlamydia neg  Genetic screening Negative NIPT  1 hour GTT 157  3 hour GTT 81 / 231 / 163 / 51  GBS negative      Plan:  [name] was discharged to home in good condition. Follow-up appointment with delivering provider in 6 weeks.  Discharge Medications: Allergies as of 09/05/2018   No Known  Allergies     Medication List    STOP taking these medications   aspirin 81 MG EC tablet   Contour Test test strip Generic drug: glucose blood   glyBURIDE 5 MG tablet Commonly known as: DIABETA   Microlet Lancets Misc   prenatal multivitamin Tabs tablet   promethazine 25 MG tablet Commonly known as: PHENERGAN     TAKE these medications   ibuprofen 600 MG tablet Commonly known as: ADVIL Take 1 tablet (600 mg total) by mouth every 6 (six) hours.       Follow-up Information    Ward, Honor Loh, MD Follow up in 6 week(s).   Specialty: Obstetrics and Gynecology Why: pp exam  Contact information: [place] [id number] [phone]           Signed: Laverta Baltimore MD Hit refresh and delete this line

## 2018-09-05 LAB — CBC
HCT: 40.7 % (ref 36.0–46.0)
Hemoglobin: 13.4 g/dL (ref 12.0–15.0)
MCH: 32.3 pg (ref 26.0–34.0)
MCHC: 32.9 g/dL (ref 30.0–36.0)
MCV: 98.1 fL (ref 80.0–100.0)
Platelets: 300 10*3/uL (ref 150–400)
RBC: 4.15 MIL/uL (ref 3.87–5.11)
RDW: 13.4 % (ref 11.5–15.5)
WBC: 7.5 10*3/uL (ref 4.0–10.5)
nRBC: 0 % (ref 0.0–0.2)

## 2018-09-05 MED ORDER — IBUPROFEN 600 MG PO TABS: 600.0000 mg | ORAL_TABLET | Freq: Four times a day (QID) | ORAL | 0 refills | Status: AC

## 2018-09-05 NOTE — Progress Notes (Signed)
Reviewed D/C instructions with pt and family. Pt verbalized understanding of teaching. Discharged to home via W/C. Pt to schedule f/u appt.  

## 2018-09-06 ENCOUNTER — Other Ambulatory Visit: Payer: Self-pay

## 2018-09-06 ENCOUNTER — Ambulatory Visit: Payer: Self-pay

## 2018-09-06 DIAGNOSIS — Z20822 Contact with and (suspected) exposure to covid-19: Secondary | ICD-10-CM

## 2018-09-07 LAB — NOVEL CORONAVIRUS, NAA: SARS-CoV-2, NAA: DETECTED — AB

## 2018-09-15 ENCOUNTER — Encounter: Payer: Self-pay | Admitting: Nurse Practitioner

## 2018-09-15 NOTE — Progress Notes (Signed)
A user error has taken place: encounter opened in error, closed for administrative reasons.

## 2018-11-10 ENCOUNTER — Ambulatory Visit: Payer: Self-pay | Admitting: Advanced Practice Midwife

## 2018-11-10 ENCOUNTER — Other Ambulatory Visit: Payer: Self-pay

## 2018-11-10 ENCOUNTER — Encounter: Payer: Self-pay | Admitting: Advanced Practice Midwife

## 2018-11-10 VITALS — BP 108/73 | Wt 229.0 lb

## 2018-11-10 DIAGNOSIS — Z3009 Encounter for other general counseling and advice on contraception: Secondary | ICD-10-CM

## 2018-11-10 LAB — WET PREP FOR TRICH, YEAST, CLUE
Trichomonas Exam: NEGATIVE
Yeast Exam: NEGATIVE

## 2018-11-10 NOTE — Progress Notes (Addendum)
Here today for PP exam. NSVD 09/04/2018 @ [facility]. Plans Nexplanon for PP BCM. Hal Morales, RN Wet Prep results reviewed. Per standing orders no treatment indicated. 2hgtt and Hgb lab scheduled along with Nexplanon Insertion appt for 11/11/2018 @ 8:45. Instructed patient not to eat or drink anything after midnight tonight and to arrive at 8:00 for check in for lab appt and explained that Nexplanon Insertion will follow. Patient verbalized understanding of above instructions and information.  Hal Morales, RN

## 2018-11-10 NOTE — Progress Notes (Signed)
[facility] Partum Exam  [place] Courtney Munoz is a 33 y.o. [phone] female who presents for a postpartum visit. She is 9 weeks postpartum following a spontaneous vaginal delivery. I have fully reviewed the prenatal and intrapartum course. The delivery was at 39.0 gestational weeks.  Anesthesia: epidural. Postpartum course has been wnl. Baby's course has been wnl. Baby is feeding by breast. Bleeding no bleeding. Bowel function is normal. Bladder function is normal. Patient is not sexually active. Contraception method is abstinence.   Postpartum depression screening:    The following portions of the patient's history were reviewed and updated as appropriate: allergies, current medications, past family history, past medical history, past social history, past surgical history and problem list. Last pap smear done 02/22/18 and was Normal  Review of Systems Pertinent items are noted in HPI.    Objective:  BP 108/73   Wt 229 lb (103.9 kg)   LMP 10/19/2018 (Approximate) Comment: Unsure  Breastfeeding Yes   BMI 40.57 kg/m   General:  alert   Breasts:  negative findings: nipples everted without rashes or discharge  Lungs: clear to auscultation bilaterally  Heart:  regular rate and rhythm, S1, S2 normal, no murmur, click, rub or gallop  Abdomen: soft, non-tender; bowel sounds normal; no masses,  no organomegaly   Vulva:  normal  Vagina: normal vagina  Cervix:  multiparous appearance  Corpus: normal size, contour, position, consistency, mobility, non-tender  Adnexa:  normal adnexa  Rectal Exam: Not performed.        Assessment:    9.5 wkpostpartum exam. Pap smear not done at today's visit.   Plan:   1. Contraception: abstinence 2. Infant feeding:  patient is currently feeding with breastmilk.  If breastmilk feeding patient was given letter for employer to provide appropriate pumping time to express breastmilk.  3. Mood: EPDS is low risk. Reviewed resources and that mood sx in first year  after pregnancy are considered related to pregnancy and to reach out for help at [facility] if needed. Discussed [facility] as link to care and availability of LCSW for counseling  4. Chronic Medical Conditions:  obesity Gestational diabetes  Patient given handout about PCP care in the community Given MVI per family planning program  Follow up in: 1 day or as needed for 2 hr pp and Nexplanon insertion

## 2018-11-11 ENCOUNTER — Ambulatory Visit: Payer: Self-pay | Admitting: Physician Assistant

## 2018-11-11 ENCOUNTER — Encounter: Payer: Self-pay | Admitting: Physician Assistant

## 2018-11-11 ENCOUNTER — Other Ambulatory Visit: Payer: Self-pay | Admitting: Advanced Practice Midwife

## 2018-11-11 VITALS — BP 107/75 | Ht 63.0 in | Wt 230.8 lb

## 2018-11-11 DIAGNOSIS — O24410 Gestational diabetes mellitus in pregnancy, diet controlled: Secondary | ICD-10-CM

## 2018-11-11 DIAGNOSIS — Z308 Encounter for other contraceptive management: Secondary | ICD-10-CM

## 2018-11-11 LAB — HEMOGLOBIN, FINGERSTICK: Hemoglobin: 13.4 g/dL (ref 11.1–15.9)

## 2018-11-11 MED ORDER — ETONOGESTREL 68 MG ~~LOC~~ IMPL
68.0000 mg | DRUG_IMPLANT | Freq: Once | SUBCUTANEOUS | Status: AC
  Administered 2018-11-11: 09:00:00 68 mg via SUBCUTANEOUS

## 2018-11-11 NOTE — Progress Notes (Signed)
In for 2 Hr Gtt and [place], RN

## 2018-11-11 NOTE — Progress Notes (Signed)
Nexplanon Insertion Procedure Patient identified, informed consent performed, consent signed.   Patient does understand that irregular bleeding is a very common side effect of this medication. She was advised to have backup contraception after placement. Patient was determined to meet WHO criteria for not being pregnant. Appropriate time out taken.  The insertion site was identified 8-10 cm (3-4 inches) from the medial epicondyle of the humerus and 3-5 cm (1.25-2 inches) posterior to (below) the sulcus (groove) between the biceps and triceps muscles of the patient's left arm and marked.  Patient was prepped with alcohol swab and then injected with 3 ml of 1% lidocaine.  Arm was prepped with chlorhexidene, Nexplanon removed from packaging,  Device confirmed in needle, then inserted full length of needle and withdrawn per handbook instructions. Nexplanon was able to palpated in the patient's arm; patient palpated the insert herself. There was minimal blood loss.  Patient insertion site covered with guaze and a pressure bandage to reduce any bruising.  The patient tolerated the procedure well and was given post procedure instructions.   Of note, pt also here for labs to complete 11/10/18 postpartum visit (2h OGTT due to hx GDM and routine hgb.)

## 2018-11-12 LAB — GLUCOSE TOLERANCE, 2 HOURS
Glucose, 2 hour: 79 mg/dL (ref 65–139)
Glucose, GTT - Fasting: 89 mg/dL (ref 65–99)

## 2018-11-19 LAB — GLUCOSE TOLERANCE, 2 HOURS

## 2019-01-17 ENCOUNTER — Encounter: Payer: Self-pay | Admitting: Family Medicine

## 2019-10-09 IMAGING — US US MFM OB COMPLETE +14 WKS
1 series · 13 of 28 positions shown · non-contrast
Comparison: none

PATIENT INFO:

             BRE
PERFORMED BY:
                   Sonographer
                   COMANCHE CNM
SERVICE(S) PROVIDED:
  US MFM OB COMP LESS THAN 14 WEEKS                    76801.4
 ----------------------------------------------------------------------
INDICATIONS:
  13 weeks gestation of pregnancy
  First trimester screen
  AMA
  Increased BMI
FETAL EVALUATION:
 Num Of Fetuses:         1
 Fetal Heart Rate(bpm):  153
 Cardiac Activity:       Present
 Presentation:           Transverse, head to maternal right
 Placenta:               Anterior
 P. Cord Insertion:      Normal
BIOMETRY:
 CRL:        76  mm     G. Age:  13w 5d                  EDD:   09/11/18
GESTATIONAL AGE:
 LMP:           12w 5d        Date:  12/12/17                 EDD:   09/18/18
 Best:          13w 5d     Det. By:  U/S C R L (03/11/18)     EDD:   09/11/18
ANATOMY:
 Cranium:               Within Normal Limits   Bladder:                Seen
 Choroid Plexus:        Within normal limits   Upper Extremities:      Visualized
                        for gestational age
 Stomach:               Seen                   Lower Extremities:      Visualized
 Abdominal Wall:        Within normal limits
CERVIX UTERUS ADNEXA:
 Cervix
 Length:           3.36  cm.

[Series 1: us mfm ob complete +14 wks · 0.20mm/px · 13 of 56 slices shown]
[im 3/56]
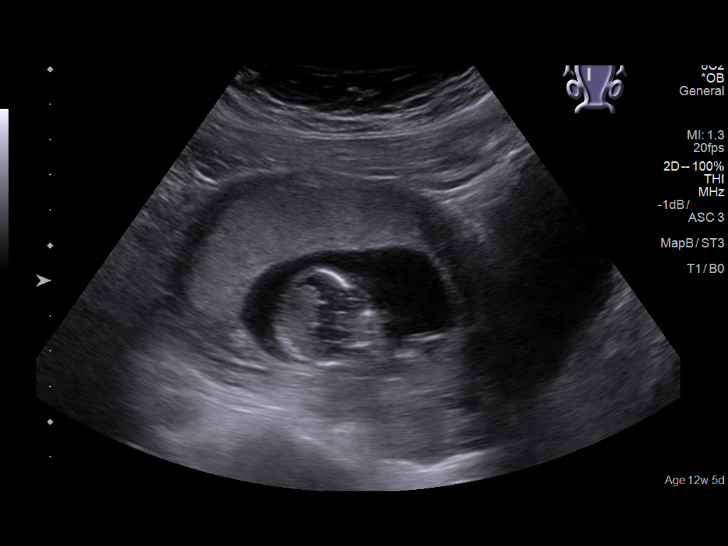
[im 7/56]
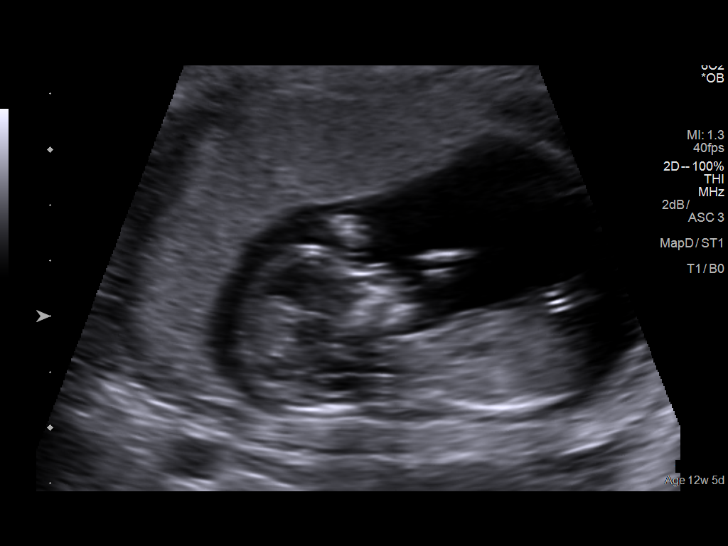
[im 11/56]
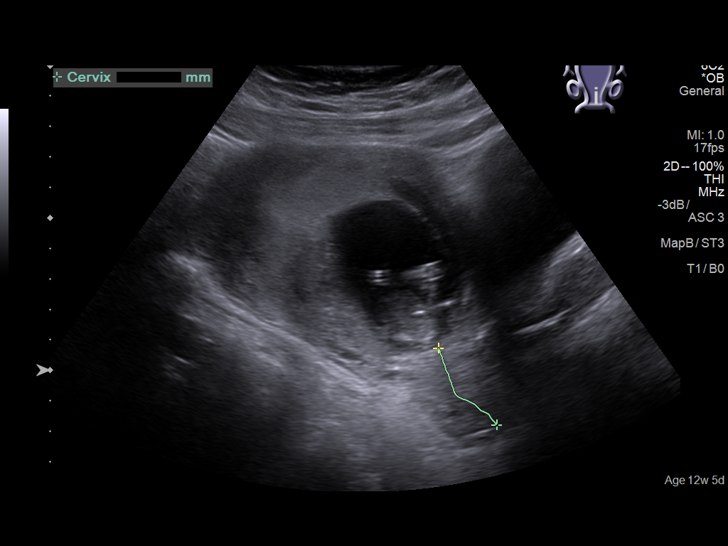
[im 15/56]
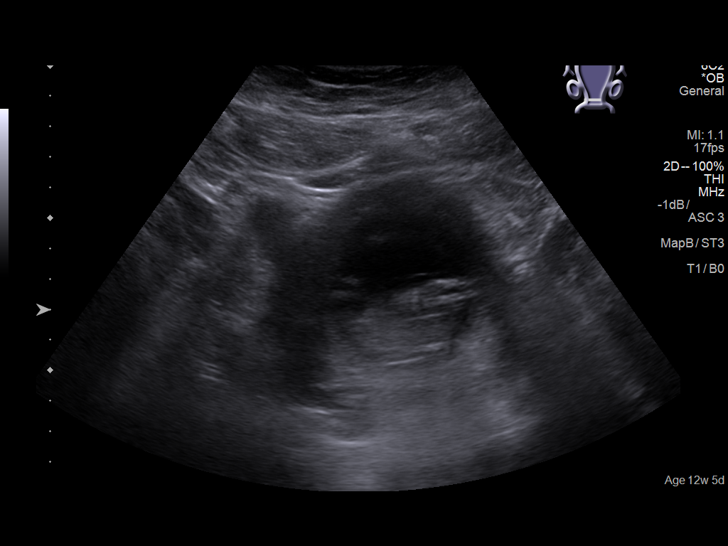
[im 19/56]
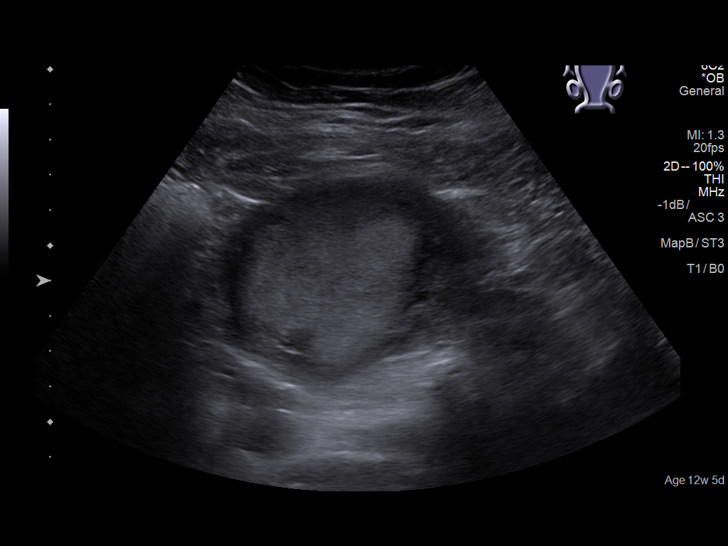
[im 23/56]
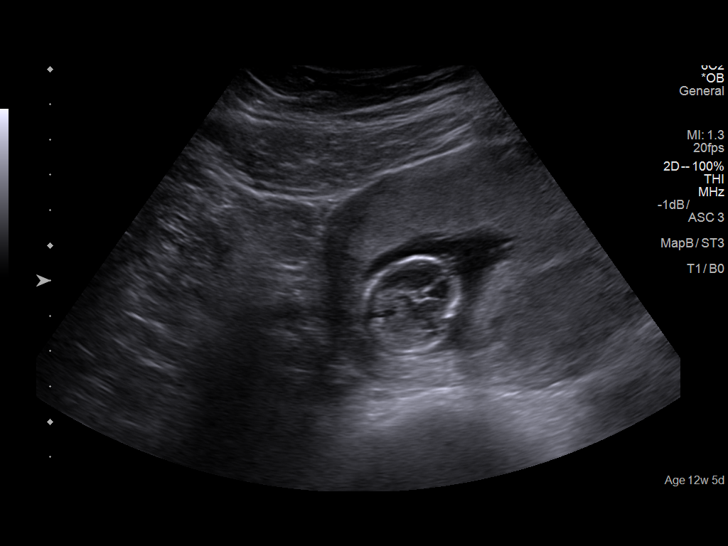
[im 29/56]
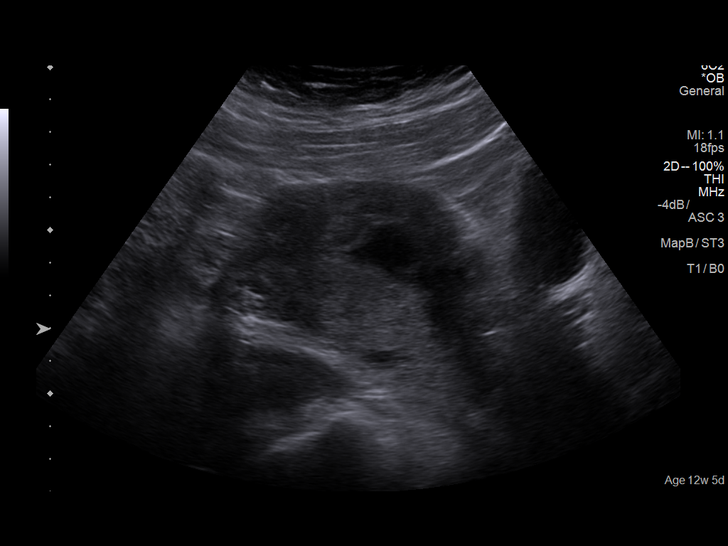
[im 33/56]
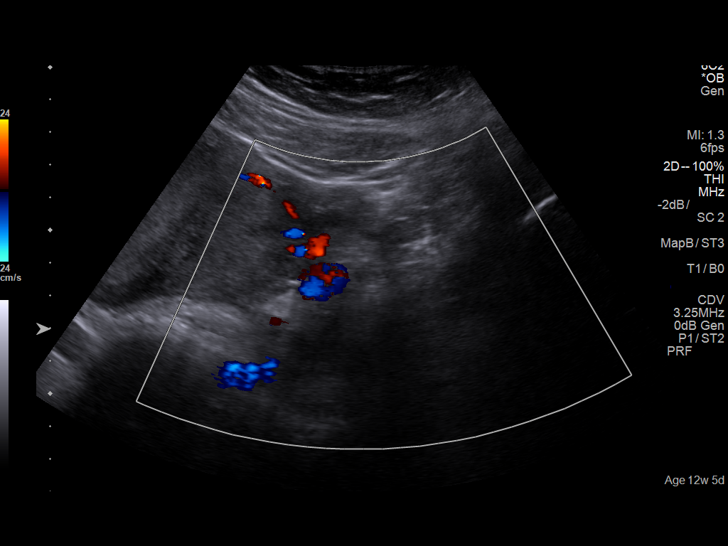
[im 37/56]
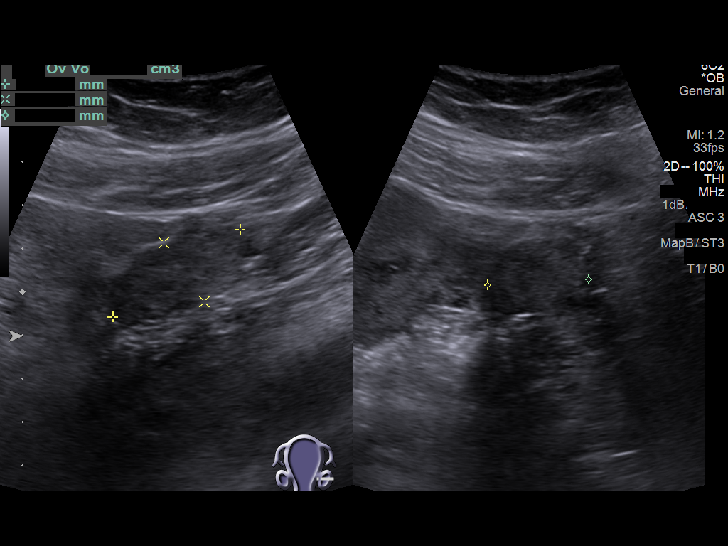
[im 41/56]
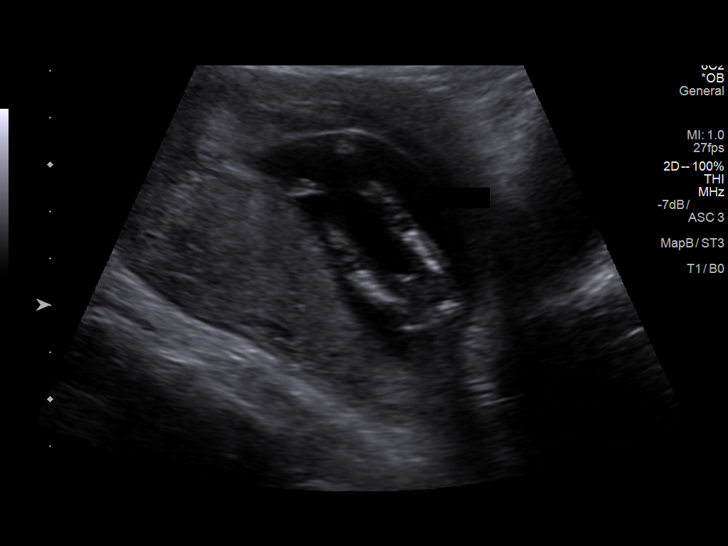
[im 45/56]
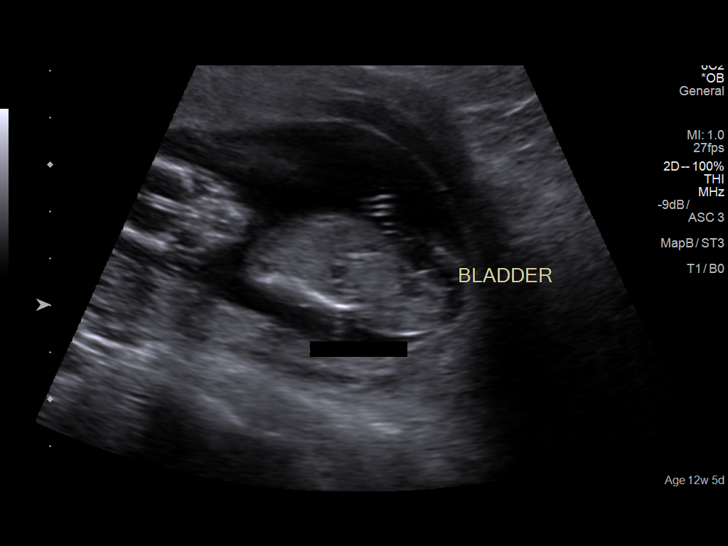
[im 49/56]
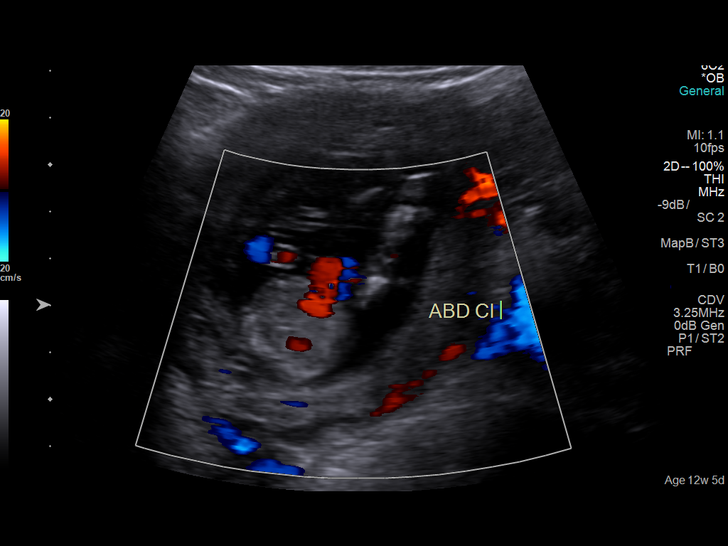
[im 53/56]
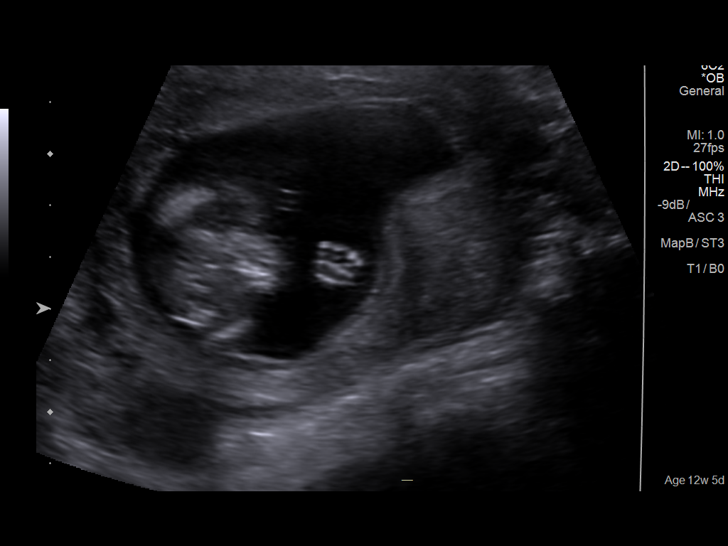

[13 of 28 positions shown; findings below may reference images not displayed]

IMPRESSION: Thank you for referring your patient for aneuploidy screening.
 She had the opportunity to meet with our genetic counselor
 today due to advanced maternal age and elected to have
 cffDNA screening.  Please see that note for details.

 Ultrasound demonstrates a single, live, fetus at 13  weeks 5
 days.  Dating is by today's ultrasound.
 First trimester anatomic survey demonstrates a symmetric
 choroid plexus, fetal bladder, stomach, and limbs.  The
 gestational age is too early for a complete anatomic survey.
 Maternal ovaries appear normal bilaterally.
 We will collect blood for screening and notify you and the
 patient of the results.
 Thank you for allowing us to participate in your patient's care.

                  [place], Renold

## 2019-10-12 ENCOUNTER — Ambulatory Visit (LOCAL_COMMUNITY_HEALTH_CENTER): Payer: Self-pay | Admitting: Physician Assistant

## 2019-10-12 ENCOUNTER — Telehealth: Payer: Self-pay | Admitting: Family Medicine

## 2019-10-12 ENCOUNTER — Other Ambulatory Visit: Payer: Self-pay

## 2019-10-12 VITALS — BP 127/83 | Ht 64.0 in | Wt 250.0 lb

## 2019-10-12 DIAGNOSIS — Z3009 Encounter for other general counseling and advice on contraception: Secondary | ICD-10-CM

## 2019-10-12 NOTE — Progress Notes (Signed)
   [facility] problem visit  [facility]  Subjective:  Munoz Munoz is a 34 y.o. being seen today for questions about her period with the Nexplanon.  Chief Complaint  Patient presents with  . Contraception    HPI  Patient reports that she has only bled one time since she delivered her baby last year and is concerned that there is something wrong.  Patient reports that she did breast feed for a while after delivery and that she got the Nexplanon placed 11/2018.  Also, patient asks what menopause is.  Does the patient have a current or past history of drug use? No   No components found for: HCV]   Health Maintenance Due  Topic Date Due  . Hepatitis C Screening  Never done  . COVID-19 Vaccine (1) Never done  . TETANUS/TDAP  Never done  . INFLUENZA VACCINE  Never done    Review of Systems  All other systems reviewed and are negative.   The following portions of the patient's history were reviewed and updated as appropriate: allergies, current medications, past family history, past medical history, past social history, past surgical history and problem list. Problem list updated.   See flowsheet for other program required questions.  Objective:   Vitals:   10/12/19 0929  BP: 127/83  Weight: 250 lb (113.4 kg)  Height: 5\' 4"  (1.626 m)    Physical Exam Vitals and nursing note reviewed.  Constitutional:      General: She is not in acute distress.    Appearance: Normal appearance.  HENT:     Head: Normocephalic and atraumatic.  Eyes:     Conjunctiva/sclera: Conjunctivae normal.  Pulmonary:     Effort: Pulmonary effort is normal.  Neurological:     Mental Status: She is alert and oriented to person, place, and time.  Psychiatric:        Mood and Affect: Mood normal.        Behavior: Behavior normal.        Thought Content: Thought content normal.        Judgment: Judgment normal.       Assessment and Plan:   Munoz Munoz is a 34 y.o. female presenting to the [facility] Department for a Women's Health problem visit  1. Encounter for counseling regarding contraception Counseled patient re:  Normal SE of Nexplanon can be that she does not have a period while she is using this method. Counseled that since she was breastfeeding just after delivery and also got the Nexplanon placed, she did not have any lining in her uterus so she will likely continue to not have a period with the Nexplanon since the hormone from that prevents the lining from forming. Reassured patient that it is normal to have this happen after Nexplanon insertion and not harmful, though it can take some getting used to not having a period. Counseled patient re: menopause- what happens, what the average age is and how to determine whether she has gone through menopause. Reassured patient that she can have the Nexplanon removed before the 3 years is up if she desires to either get pregnant or try a different method.     Return for 11/2019 for RP and prn.  No future appointments.  12/2019, PA

## 2019-10-12 NOTE — Progress Notes (Signed)
Patient states that she gave birth last August and has not had a period since.Nexplanon places September 2020. Patient satisfied with BCM. RN explained to patient that irregular bleeding is normal with implant and she could possible have a period or not have one at all. Harvie Heck, RN

## 2019-10-18 NOTE — Telephone Encounter (Signed)
Pt came into office today.  

## 2019-11-13 IMAGING — US US MFM OB DETAIL +14 WK
1 series · 12 of 28 positions shown · non-contrast
Comparison: none

PATIENT INFO:

             BRISA
PERFORMED BY:
                   JUMPER CNM
SERVICE(S) PROVIDED:
 ----------------------------------------------------------------------
INDICATIONS:
  18 weeks gestation of pregnancy
  Advance maternal age
FETAL EVALUATION:
 Num Of Fetuses:          1
 Fetal Heart              137
 Rate(bpm):
 Cardiac Activity:        Present
 Presentation:            Breech
 Placenta:                Anterior, no previa
 AFI Sum(cm)     %Tile       Largest Pocket(cm)
 3.52            < 3
 RUQ(cm)
BIOMETRY:
 BPD:      39.9  mm     G. Age:  18w 1d         25  %    CI:         73.22  %    70 - 86
                                                         FL/HC:       16.3  %    16.1 -
 HC:      148.2  mm     G. Age:  17w 6d         12  %
                                                         FL/BPD:      60.4  %
 FL:       24.1  mm     G. Age:  17w 2d          6  %
 HUM:        26  mm     G. Age:  18w 1d         37  %
 NFT:       2.7  mm
 CM:        3.4  mm
GESTATIONAL AGE:
 LMP:           17w 5d        Date:  12/12/17                 EDD:    09/18/18
 U/S Today:     17w 5d                                        EDD:    09/18/18
 Best:          18w 5d     Det. By:  U/S C R L  (03/11/18)    EDD:    09/11/18
ANATOMY:
 Cranium:               Within Normal          Aortic Arch:            Normal appearance
                        Limits
 Cavum:                 CSP visualized         Ductal Arch:            Normal appearance
 Ventricles:            Normal appearance      Diaphragm:              Within Normal
                                                                       Limits
 Choroid Plexus:        Within Normal          Stomach:                Seen
 Cerebellum:            Within Normal          Abdomen:                Within Normal
                        Limits                                         Limits
 Posterior Fossa:       Within Normal          Abdominal Wall:         Normal appearance
 Nuchal Fold:           Within Normal          Cord Vessels:           3 vessels
 Face:                  Orbits visualized      Kidneys:                Normal appearance
 Lips:                  Normal appearance      Bladder:                Seen
 Thoracic:              Within Normal          Spine:                  Normal appearance
 Heart:                 4-Chamber view         Upper Extremities:      Visualized
                        appears normal
 RVOT:                  Normal appearance      Lower Extremities:      Visualized
 LVOT:                  Normal appearance
CERVIX UTERUS ADNEXA:
 Cervix
 Length:           3.79  cm.

[Series 1: us mfm ob detail +14 wk · 0.25mm/px · 12 of 64 slices shown]
[im 3/64]
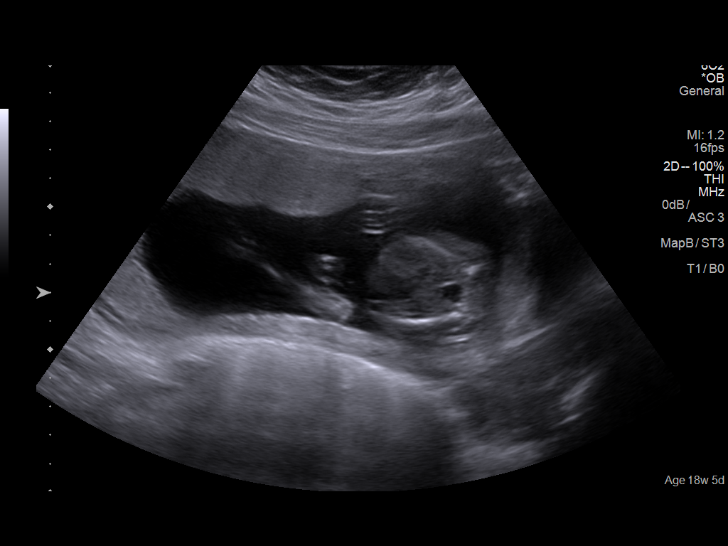
[im 8/64]
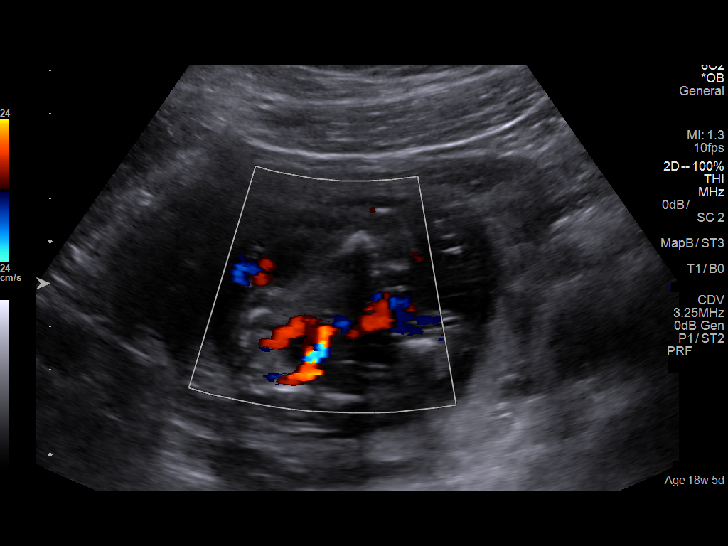
[im 12/64]
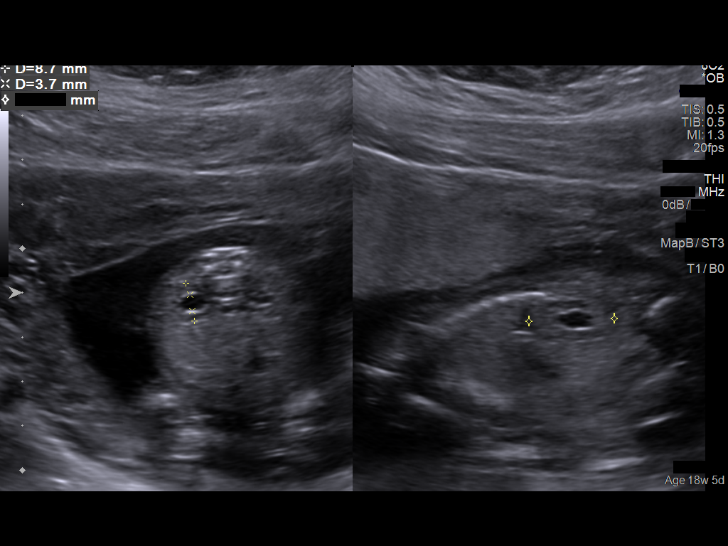
[im 19/64]
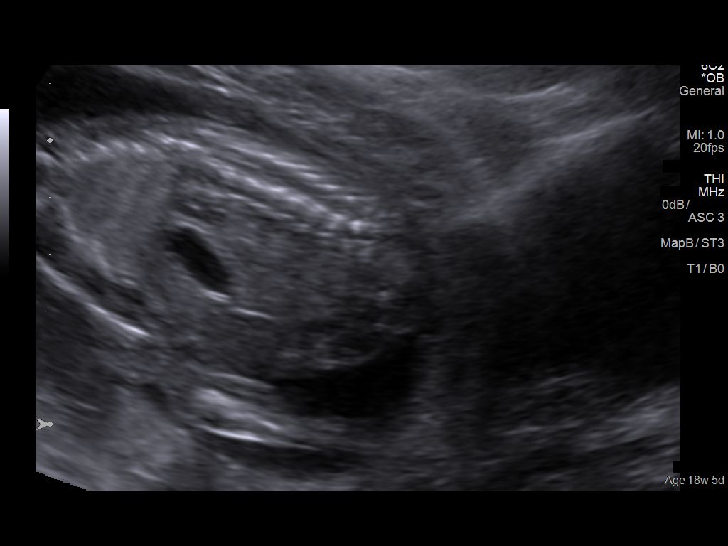
[im 24/64]
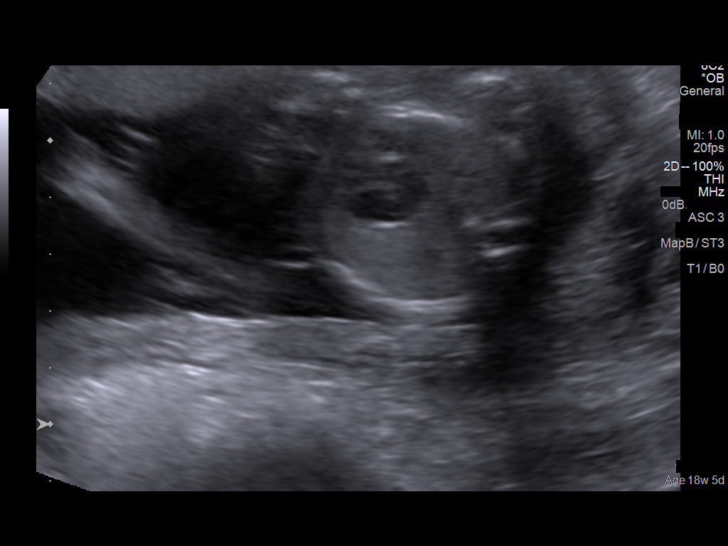
[im 29/64]
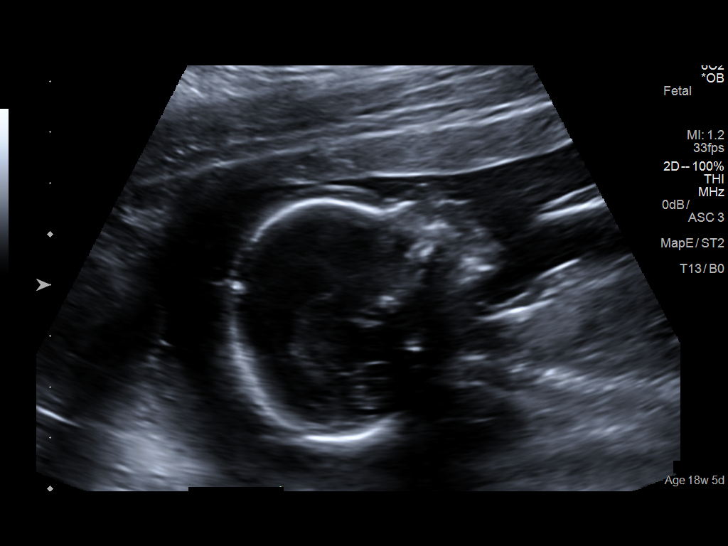
[im 36/64]
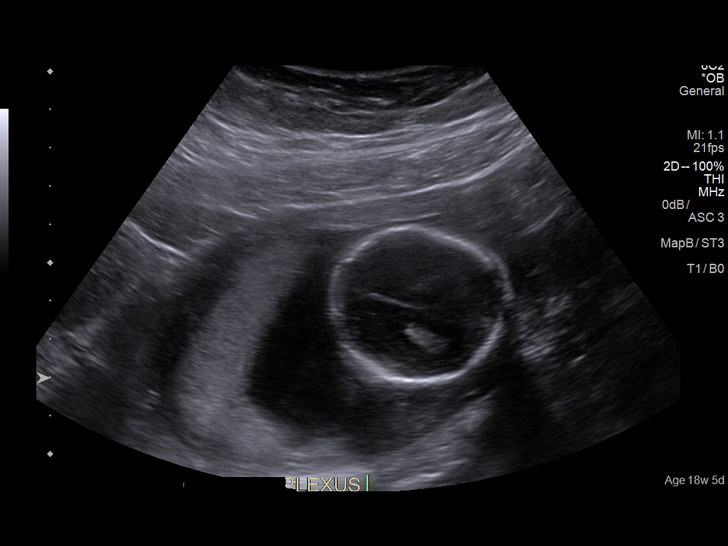
[im 40/64]
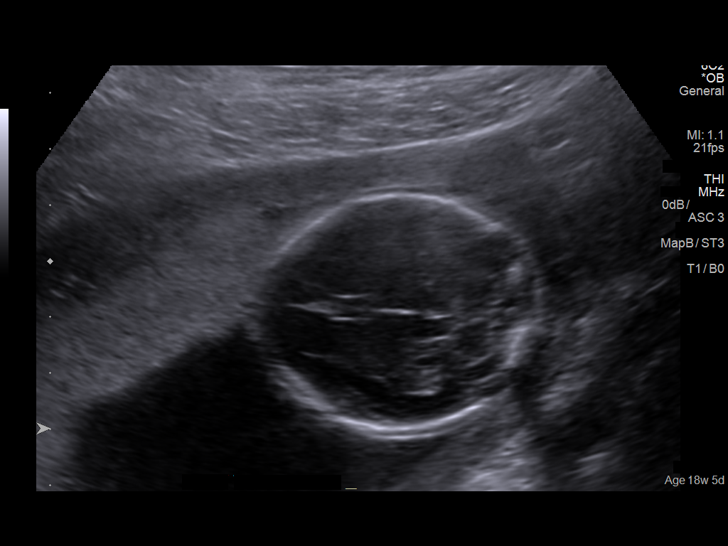
[im 45/64]
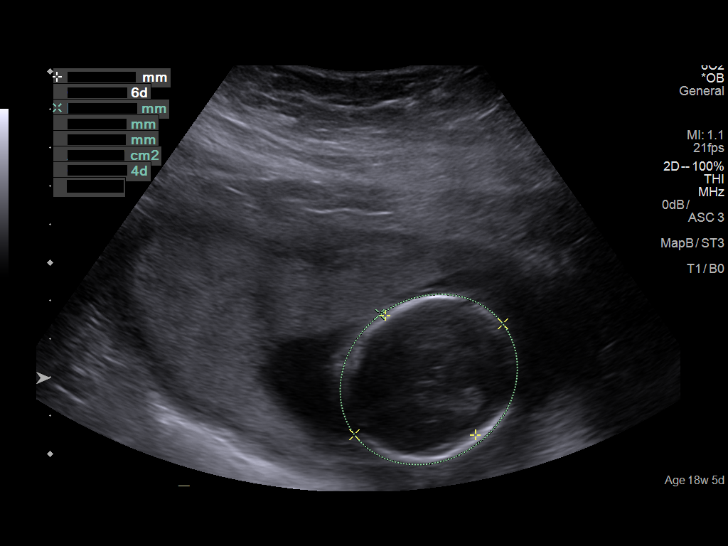
[im 52/64]
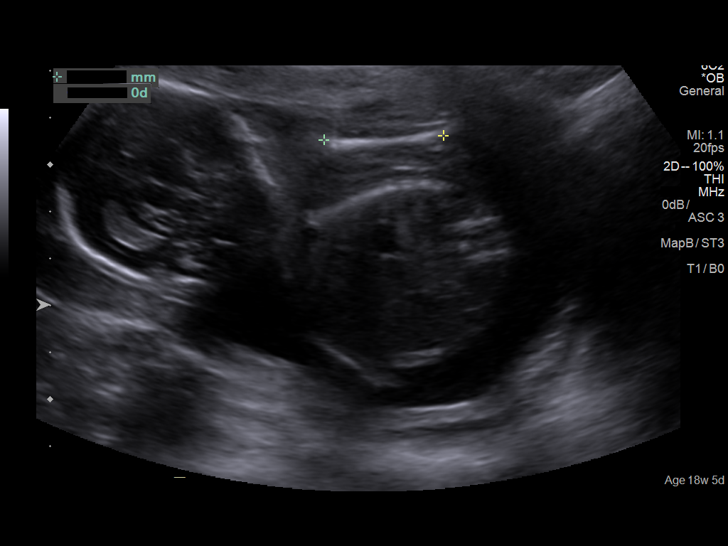
[im 57/64]
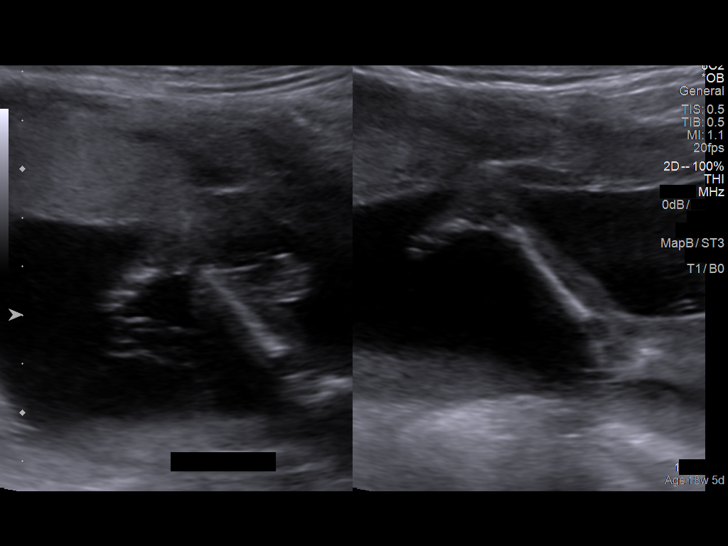
[im 61/64]
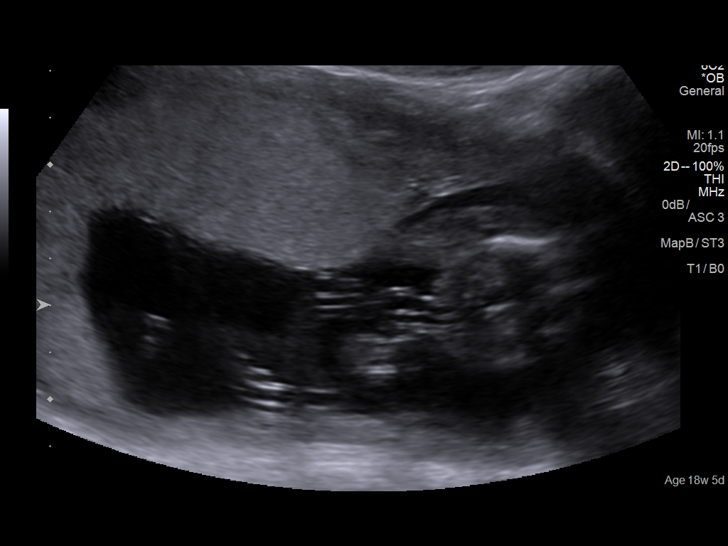

[12 of 28 positions shown; findings below may reference images not displayed]

IMPRESSION: Dear Dr.  JUMPER,

 Thank you for referring your patient to [facility] for a
 detailed fetal anatomical survey for AMA.

 There is a singleton gestation with subjectively normal
 amniotic fluid volume.

 The fetal biometry correlates with established dating by
 24w0d ultrasound.

 Detailed evaluation of the fetal anatomy was performed.
 The fetal anatomical survey appears within normal limits
 within the resolution of ultrasound as described above.

 It must be noted that a normal ultrasound cannot rule out
 aneuploidy.

 The patient has had low risk screening with cfDNA

 Follow-up is advised if clinically indicated.

 Thank you for allowing us to participate in your patient's
 further assistance.

                [place], Carlan

## 2021-10-15 ENCOUNTER — Ambulatory Visit: Payer: Self-pay

## 2021-10-15 ENCOUNTER — Ambulatory Visit (LOCAL_COMMUNITY_HEALTH_CENTER): Payer: Self-pay | Admitting: Family Medicine

## 2021-10-15 ENCOUNTER — Encounter: Payer: Self-pay | Admitting: Family Medicine

## 2021-10-15 VITALS — BP 115/81 | Ht 64.0 in | Wt 254.4 lb

## 2021-10-15 DIAGNOSIS — Z01419 Encounter for gynecological examination (general) (routine) without abnormal findings: Secondary | ICD-10-CM

## 2021-10-15 DIAGNOSIS — Z113 Encounter for screening for infections with a predominantly sexual mode of transmission: Secondary | ICD-10-CM

## 2021-10-15 DIAGNOSIS — Z3009 Encounter for other general counseling and advice on contraception: Secondary | ICD-10-CM

## 2021-10-15 LAB — HM HEPATITIS C SCREENING LAB: HM Hepatitis Screen: NEGATIVE

## 2021-10-15 LAB — HM HIV SCREENING LAB: HM HIV Screening: NEGATIVE

## 2021-10-15 NOTE — Progress Notes (Unsigned)
Pt here for PE and STD screening.  FP packet given.  Berdie Ogren, RN

## 2021-10-15 NOTE — Progress Notes (Signed)
[facility] DEPARTMENT [facility] [address] Main Number: [phone]  Family Planning Visit- Repeat Yearly Visit  Subjective:  Courtney Munoz is a 36 y.o. [phone]  being seen today for an annual wellness visit and to discuss contraception options.   The patient is currently using Hormonal Implant for pregnancy prevention. Patient does not want a pregnancy in the next year.   she/her/hers report they are looking for a method that provides High efficacy at preventing pregnancy   Patient has the following medical problems: has History of gestational diabetes and Obesity, unspecified on their problem list.  Chief Complaint  Patient presents with  . Contraception    Physical, Std screening  and nexplanon removal and reinsertion      Patient reports***  Patient denies ***   See flowsheet for other program required questions.   Body mass index is 43.67 kg/m. - Patient is eligible for diabetes screening based on BMI and age >48?  no HA1C ordered? No-- high risk for T2Dm due to GDM in pregnancy.   Patient reports 1 of partners in last year. Desires STI screening?  Yes   Has patient been screened once for HCV in the past?  No  No results found for: "HCVAB"  Does the patient have current of drug use, have a partner with drug use, and/or has been incarcerated since last result? No  If yes-- Screen for HCV through [facility]   Does the patient meet criteria for HBV testing? No  Criteria:  -Household, sexual or needle sharing contact with HBV -History of drug use -HIV positive -Those with known Hep C   Health Maintenance Due  Topic Date Due  . COVID-19 Vaccine (1) Never done  . Hepatitis C Screening  Never done  . PAP SMEAR-Modifier  02/24/2021  . INFLUENZA VACCINE  Never done    Review of Systems  Constitutional:  Negative for chills and fever.  Eyes:  Negative for blurred vision and double vision.  Respiratory:   Negative for cough and shortness of breath.   Cardiovascular:  Negative for chest pain and orthopnea.  Gastrointestinal:  Negative for nausea and vomiting.  Genitourinary:  Negative for dysuria, flank pain and frequency.  Musculoskeletal:  Negative for myalgias.  Skin:  Negative for rash.  Neurological:  Negative for dizziness, tingling, weakness and headaches.  Endo/Heme/Allergies:  Does not bruise/bleed easily.  Psychiatric/Behavioral:  Negative for depression and suicidal ideas. The patient is not nervous/anxious.     The following portions of the patient's history were reviewed and updated as appropriate: allergies, current medications, past family history, past medical history, past social history, past surgical history and problem list. Problem list updated.  Objective:   Vitals:   10/15/21 0847  BP: 115/81  Weight: 254 lb 6.4 oz (115.4 kg)  Height: 5\' 4"  (1.626 m)    Physical Exam Exam conducted with a chaperone present.  Constitutional:      Appearance: Normal appearance.  HENT:     Head: Normocephalic and atraumatic.  Pulmonary:     Effort: Pulmonary effort is normal.  Chest:  Breasts:    Tanner Score is 5.     Breasts are symmetrical.     Right: Normal. No swelling, inverted nipple, mass, nipple discharge, skin change or tenderness.     Left: Normal. No swelling, inverted nipple, mass, nipple discharge, skin change or tenderness.  Abdominal:     Palpations: Abdomen is soft.  Musculoskeletal:  General: Normal range of motion.  Lymphadenopathy:     Upper Body:     Right upper body: No axillary or pectoral adenopathy.     Left upper body: No axillary or pectoral adenopathy.  Skin:    General: Skin is warm and dry.  Neurological:     General: No focal deficit present.     Mental Status: She is alert.  Psychiatric:        Mood and Affect: Mood normal.        Behavior: Behavior normal.      Assessment and Plan:  Courtney Munoz is a 36  y.o. female [phone] presenting to the [facility] Department for an yearly wellness and contraception visit   Contraception counseling: Reviewed options based on patient desire and reproductive life plan. Patient is interested in Hormonal Implant. Placed in October 2020. Counseled about extended use for 4-5 years. Patient desires to keep until 2024.   Risks, benefits, and typical effectiveness rates were reviewed.  Questions were answered.  Written information was also given to the patient to review.    The patient will follow up in  1 years for surveillance.  The patient was told to call with any further questions, or with any concerns about this method of contraception.  Emphasized use of condoms 100% of the time for STI prevention.   1. Well woman exam with routine gynecological exam ASking about [place] -- discussed establishing PCP care for provision.  Reviewed contraceptive plans, patient desire to keep nexplanon  Needs Pap today due to last pap in 2020 and unable to confirm HPV  Given PCP list CBE today WNL - IGP, [facility] HPV - HIV/HCV [place] [facility] - Syphilis Serology, [place] [facility] - Chlamydia/Gonorrhea [place] [facility]  2. Screening examination for venereal disease - HIV/HCV [place] [facility] - Syphilis Serology, [place] [facility] - Chlamydia/Gonorrhea [place] [facility]    Return in about 1 year (around 10/16/2022) for Yearly wellness exam with Nexplanon Removal and Reinsertion.  No future appointments.  Federico Flake, MD

## 2021-10-16 ENCOUNTER — Encounter: Payer: Self-pay | Admitting: Family Medicine

## 2021-10-21 LAB — IGP, APTIMA HPV
HPV Aptima: NEGATIVE
PAP Smear Comment: 0

## 2023-10-07 ENCOUNTER — Other Ambulatory Visit: Payer: Self-pay

## 2023-10-07 ENCOUNTER — Emergency Department
Admission: EM | Admit: 2023-10-07 | Discharge: 2023-10-07 | Disposition: A | Discharge status: Home / Self Care | Payer: Self-pay | Attending: Emergency Medicine | Admitting: Emergency Medicine

## 2023-10-07 ENCOUNTER — Emergency Department: Payer: Self-pay

## 2023-10-07 DIAGNOSIS — K2970 Gastritis, unspecified, without bleeding: Secondary | ICD-10-CM | POA: Insufficient documentation

## 2023-10-07 DIAGNOSIS — Z8616 Personal history of COVID-19: Secondary | ICD-10-CM | POA: Insufficient documentation

## 2023-10-07 DIAGNOSIS — N63 Unspecified lump in unspecified breast: Secondary | ICD-10-CM | POA: Insufficient documentation

## 2023-10-07 LAB — URINALYSIS, ROUTINE W REFLEX MICROSCOPIC
Bilirubin Urine: NEGATIVE
Glucose, UA: NEGATIVE mg/dL
Hgb urine dipstick: NEGATIVE
Ketones, ur: NEGATIVE mg/dL
Leukocytes,Ua: NEGATIVE
Nitrite: NEGATIVE
Protein, ur: NEGATIVE mg/dL
Specific Gravity, Urine: 1.024 (ref 1.005–1.030)
pH: 8 (ref 5.0–8.0)

## 2023-10-07 LAB — CBC WITH DIFFERENTIAL/PLATELET
Abs Immature Granulocytes: 0.04 K/uL (ref 0.00–0.07)
Basophils Absolute: 0 K/uL (ref 0.0–0.1)
Basophils Relative: 0 %
Eosinophils Absolute: 0.1 K/uL (ref 0.0–0.5)
Eosinophils Relative: 1 %
HCT: 39.1 % (ref 36.0–46.0)
Hemoglobin: 12.8 g/dL (ref 12.0–15.0)
Immature Granulocytes: 0 %
Lymphocytes Relative: 20 %
Lymphs Abs: 2.3 K/uL (ref 0.7–4.0)
MCH: 31.5 pg (ref 26.0–34.0)
MCHC: 32.7 g/dL (ref 30.0–36.0)
MCV: 96.3 fL (ref 80.0–100.0)
Monocytes Absolute: 0.7 K/uL (ref 0.1–1.0)
Monocytes Relative: 6 %
Neutro Abs: 8 K/uL — ABNORMAL HIGH (ref 1.7–7.7)
Neutrophils Relative %: 73 %
Platelets: 342 K/uL (ref 150–400)
RBC: 4.06 MIL/uL (ref 3.87–5.11)
RDW: 12.5 % (ref 11.5–15.5)
WBC: 11.2 K/uL — ABNORMAL HIGH (ref 4.0–10.5)
nRBC: 0.2 % (ref 0.0–0.2)

## 2023-10-07 LAB — COMPREHENSIVE METABOLIC PANEL WITH GFR
ALT: 20 U/L (ref 0–44)
AST: 36 U/L (ref 15–41)
Albumin: 3.1 g/dL — ABNORMAL LOW (ref 3.5–5.0)
Alkaline Phosphatase: 65 U/L (ref 38–126)
Anion gap: 10 (ref 5–15)
BUN: 11 mg/dL (ref 6–20)
CO2: 22 mmol/L (ref 22–32)
Calcium: 8.2 mg/dL — ABNORMAL LOW (ref 8.9–10.3)
Chloride: 106 mmol/L (ref 98–111)
Creatinine, Ser: 0.58 mg/dL (ref 0.44–1.00)
GFR, Estimated: >60 mL/min (ref >60)
Glucose, Bld: 152 mg/dL — ABNORMAL HIGH (ref 70–99)
Potassium: 3.5 mmol/L (ref 3.5–5.1)
Sodium: 138 mmol/L (ref 135–145)
Total Bilirubin: 0.5 mg/dL (ref 0.0–1.2)
Total Protein: 6.2 g/dL — ABNORMAL LOW (ref 6.5–8.1)

## 2023-10-07 LAB — TROPONIN I (HIGH SENSITIVITY)
Troponin I (High Sensitivity): <2 ng/L (ref <18)
Troponin I (High Sensitivity): <2 ng/L (ref <18)

## 2023-10-07 LAB — LACTIC ACID, PLASMA
Lactic Acid, Venous: 2.3 mmol/L (ref 0.5–1.9)
Lactic Acid, Venous: 1.8 mmol/L (ref 0.5–1.9)

## 2023-10-07 LAB — HCG, QUANTITATIVE, PREGNANCY: hCG, Beta Chain, Quant, S: 1 m[IU]/mL (ref <5)

## 2023-10-07 LAB — LIPASE, BLOOD: Lipase: 34 U/L (ref 11–51)

## 2023-10-07 MED ORDER — PANTOPRAZOLE SODIUM 40 MG PO TBEC: 40.0000 mg | DELAYED_RELEASE_TABLET | Freq: Every day | ORAL | 0 refills | Status: AC

## 2023-10-07 MED ORDER — FENTANYL CITRATE PF 50 MCG/ML IJ SOSY
25.0000 ug | PREFILLED_SYRINGE | Freq: Once | INTRAMUSCULAR | Status: DC
  Filled 2023-10-07 (×2): qty 1

## 2023-10-07 MED ORDER — LACTATED RINGERS IV BOLUS
1000.0000 mL | Freq: Once | INTRAVENOUS | Status: AC
  Administered 2023-10-07: 1000 mL via INTRAVENOUS

## 2023-10-07 MED ORDER — IOHEXOL 300 MG/ML  SOLN
100.0000 mL | Freq: Once | INTRAMUSCULAR | Status: AC | PRN
  Administered 2023-10-07: 100 mL via INTRAVENOUS

## 2023-10-07 MED ORDER — ONDANSETRON HCL 4 MG/2ML IJ SOLN
4.0000 mg | Freq: Once | INTRAMUSCULAR | Status: AC
  Administered 2023-10-07: 4 mg via INTRAVENOUS
  Filled 2023-10-07: qty 2

## 2023-10-07 MED ORDER — ONDANSETRON 4 MG PO TBDP: 4.0000 mg | ORAL_TABLET | Freq: Two times a day (BID) | ORAL | 0 refills | Status: AC | PRN

## 2023-10-07 NOTE — ED Provider Notes (Signed)
 7:13 AM Assumed care for off going team.   Blood pressure 112/67, pulse 73, temperature 97.7 F (36.5 C), temperature source Oral, resp. rate (!) 24, height 5' 4 (1.626 m), weight 115.2 kg, SpO2 97%, currently breastfeeding.  See their HPI for full report but in brief pending CT scan/US   7:16 AM reevaluated patient abdomen soft and nontender.  Patient reports feeling improved.  UA negative IMPRESSION: Common bile duct is slightly prominent for age. Otherwise, no acute findings.   IMPRESSION: 1. Asymmetric 2.8 cm right breast mass on series 2, image 1 is Indeterminate. Recommend Mammography.   2. No acute or inflammatory process identified in the abdomen or pelvis. Normal appendix.   I went in with a Spanish interpreter and updated patient on all the results.  Patient continues deny any symptoms has been here for over 7 hours.  She feels comfortable with discharge home.  We discussed the possibility of maybe having a stone that passed through the CBD although she has got normal LFTs so seems less likely versus viral illness versus gastritis.  Will start on PPI, Zofran  I discussed return precautions with her.  We did discuss the breast mass noted on CT and recommended mammogram.  Both her and husband were present and they expressed understanding and felt comfortable with this plan.  They understand they can follow-up with GI if symptoms are worsening.  Jacqui spanish interpretor reviewed discharge instructions.         Ernest Ronal BRAVO, MD 10/07/23 1044

## 2023-10-07 NOTE — Discharge Instructions (Addendum)
 This could have been a viral illness but if you develop worsening symptoms or any other concerns please return to the ER.  I placed you on some medications to help decrease acid and some nausea medication. Return to the ER for fevers, worsening symptoms or any other concerns  You need to follow-up with your primary care doctor to discuss your breast mass as you will need a mammogram to ensure that this is not breast cancer    IMPRESSION: Common bile duct is slightly prominent for age. Otherwise, no acute findings.   IMPRESSION: 1. Asymmetric 2.8 cm right breast mass on series 2, image 1 is Indeterminate. Recommend Mammography.   2. No acute or inflammatory process identified in the abdomen or pelvis. Normal appendix.   Podra haber sido una enfermedad viral, pero si presenta un empeoramiento de los sntomas o cualquier otra inquietud, por favor, regrese a urgencias. Le recet medicamentos para bajar la acidez y para las nuseas. Regrese a urgencias si presenta fiebre, empeoramiento de los sntomas o cualquier otra inquietud.  Debe consultar con su mdico de cabecera para hablar sobre su masa mamaria, ya que necesitar una mamografa para confirmar que no se trata de cncer de mama.

## 2023-10-07 NOTE — ED Provider Notes (Signed)
 [facility] Provider Note   Event Date/Time   First MD Initiated Contact with Patient 10/07/23 [phone]     (approximate) History  Abdominal Pain  HPI Courtney Munoz is a 38 y.o. female with stated recent diagnosis of COVID infection who presents complaining of epigastric abdominal pain with associated nausea/vomiting that began approximately 2 hours prior to arrival.  Patient also complains of of substernal chest pain that is burning and nonradiating.  Patient does not endorse any exacerbating or relieving factors for this pain.  Patient still has gallbladder ROS: Patient currently denies any vision changes, tinnitus, difficulty speaking, facial droop, sore throat, shortness of breath, diarrhea, dysuria, or weakness/numbness/paresthesias in any extremity   Physical Exam  Triage Vital Signs: ED Triage Vitals  Encounter Vitals Group     BP 10/07/23 0303 123/69     Girls Systolic BP Percentile --      Girls Diastolic BP Percentile --      Boys Systolic BP Percentile --      Boys Diastolic BP Percentile --      Pulse Rate 10/07/23 0303 80     Resp 10/07/23 0303 16     Temp 10/07/23 0303 97.7 F (36.5 C)     Temp Source 10/07/23 0303 Oral     SpO2 10/07/23 0303 98 %     Weight 10/07/23 0302 254 lb (115.2 kg)     Height 10/07/23 0302 5' 4 (1.626 m)     Head Circumference --      Peak Flow --      Pain Score 10/07/23 0302 5     Pain Loc --      Pain Education --      Exclude from Growth Chart --    Most recent vital signs: Vitals:   10/07/23 0303 10/07/23 0315  BP: 123/69 112/67  Pulse: 80 73  Resp: 16 (!) 24  Temp: 97.7 F (36.5 C)   SpO2: 98% 97%   General: Awake, oriented x4. CV:  Good peripheral perfusion. Resp:  Normal effort. Abd:  No distention.  Nontender to palpation Other:  Middle-aged obese Hispanic female resting comfortably in no acute distress ED Results / Procedures / Treatments  Labs (all labs ordered are listed, but  only abnormal results are displayed) Labs Reviewed - No data to display EKG ED ECG REPORT I, Artist MARLA Kerns, the attending physician, personally viewed and interpreted this ECG. Date: 10/07/2023 EKG Time: 0312 Rate: 74 Rhythm: normal sinus rhythm QRS Axis: normal Intervals: normal ST/T Wave abnormalities: normal Narrative Interpretation: no evidence of acute ischemia RADIOLOGY ED MD interpretation: Pending - All radiology independently interpreted and agree with radiology assessment Official radiology report(s): No results found. PROCEDURES: Critical Care performed: No Procedures MEDICATIONS ORDERED IN ED: Medications - No data to display IMPRESSION / MDM / ASSESSMENT AND PLAN / ED COURSE  I reviewed the triage vital signs and the nursing notes.                             The patient is on the cardiac monitor to evaluate for evidence of arrhythmia and/or significant heart rate changes. Patient's presentation is most consistent with acute presentation with potential threat to life or bodily function. Patient is a 38 year old female with the above-stated past medical history who presents complaining of epigastric abdominal pain with associated nausea/vomiting that began approximately 2 hours prior to arrival DDx: COVID viral infection, biliary  disease, appendicitis, gastroenteritis Plan: CBC showing leukocytosis to 11, CMP concerning for calcium of 8.2, protein of 6.2, and abdomen of 3.1 Troponin negative, lactic acid negative, UA negative, lipase negative, beta-hCG negative  Patient pending imaging results including ultrasound of the right upper quadrant at the time of shift change  Care of this patient will be signed out to the oncoming physician at the end of my shift.  All pertinent patient information conveyed and all questions answered.  All further care and disposition decisions will be made by the oncoming physician.   FINAL CLINICAL IMPRESSION(S) / ED DIAGNOSES    Final diagnoses:  None   Rx / DC Orders   ED Discharge Orders     None      Note:  This document was prepared using Dragon voice recognition software and may include unintentional dictation errors.   Taydon Nasworthy K, MD 10/08/23 [phone]

## 2023-10-07 NOTE — ED Notes (Signed)
 Interpreter used, read and reviewed d/c instructions, pt verbalized understanding, pt from department with sig other

## 2023-10-07 NOTE — ED Triage Notes (Addendum)
 Pt reports upper abd pain that began aprox 1 hour ago that radiates to her back accompanied with n/v. Pt does not have any pertinent GI medical hx. Pt very diaphoretic, lightheaded and dizzy in triage
